# Patient Record
Sex: Male | Born: 1940 | Race: White | Hispanic: No | Marital: Married | Smoking: Former smoker
Health system: Southern US, Community
[De-identification: ages and names within clinical notes are randomized; demographics above are authoritative.]

## PROBLEM LIST (undated history)

## (undated) DIAGNOSIS — D649 Anemia, unspecified: Secondary | ICD-10-CM

## (undated) DIAGNOSIS — N189 Chronic kidney disease, unspecified: Secondary | ICD-10-CM

## (undated) DIAGNOSIS — I10 Essential (primary) hypertension: Secondary | ICD-10-CM

## (undated) DIAGNOSIS — F329 Major depressive disorder, single episode, unspecified: Secondary | ICD-10-CM

## (undated) DIAGNOSIS — F32A Depression, unspecified: Secondary | ICD-10-CM

## (undated) DIAGNOSIS — F419 Anxiety disorder, unspecified: Secondary | ICD-10-CM

## (undated) DIAGNOSIS — C801 Malignant (primary) neoplasm, unspecified: Secondary | ICD-10-CM

## (undated) HISTORY — DX: Chronic kidney disease, unspecified: N18.9

## (undated) HISTORY — DX: Anxiety disorder, unspecified: F41.9

## (undated) HISTORY — DX: Major depressive disorder, single episode, unspecified: F32.9

## (undated) HISTORY — DX: Anemia, unspecified: D64.9

## (undated) HISTORY — PX: TONSILLECTOMY: SUR1361

## (undated) HISTORY — DX: Depression, unspecified: F32.A

## (undated) HISTORY — DX: Essential (primary) hypertension: I10

## (undated) HISTORY — DX: Malignant (primary) neoplasm, unspecified: C80.1

## (undated) HISTORY — PX: NOSE SURGERY: SHX723

## (undated) HISTORY — PX: KIDNEY SURGERY: SHX687

---

## 2010-11-08 DIAGNOSIS — R0982 Postnasal drip: Secondary | ICD-10-CM | POA: Insufficient documentation

## 2010-11-08 DIAGNOSIS — I10 Essential (primary) hypertension: Secondary | ICD-10-CM | POA: Insufficient documentation

## 2010-11-08 DIAGNOSIS — R05 Cough: Secondary | ICD-10-CM | POA: Insufficient documentation

## 2010-11-08 DIAGNOSIS — R682 Dry mouth, unspecified: Secondary | ICD-10-CM | POA: Insufficient documentation

## 2010-11-08 DIAGNOSIS — F319 Bipolar disorder, unspecified: Secondary | ICD-10-CM | POA: Insufficient documentation

## 2011-02-13 DIAGNOSIS — K432 Incisional hernia without obstruction or gangrene: Secondary | ICD-10-CM | POA: Insufficient documentation

## 2011-08-18 DIAGNOSIS — E7800 Pure hypercholesterolemia, unspecified: Secondary | ICD-10-CM | POA: Insufficient documentation

## 2012-05-10 DIAGNOSIS — R3915 Urgency of urination: Secondary | ICD-10-CM | POA: Insufficient documentation

## 2012-08-13 ENCOUNTER — Ambulatory Visit: Payer: Self-pay | Admitting: Neurology

## 2013-01-10 DIAGNOSIS — N289 Disorder of kidney and ureter, unspecified: Secondary | ICD-10-CM | POA: Insufficient documentation

## 2013-01-10 DIAGNOSIS — Z87448 Personal history of other diseases of urinary system: Secondary | ICD-10-CM | POA: Insufficient documentation

## 2013-01-10 DIAGNOSIS — N400 Enlarged prostate without lower urinary tract symptoms: Secondary | ICD-10-CM | POA: Insufficient documentation

## 2013-07-04 DIAGNOSIS — D509 Iron deficiency anemia, unspecified: Secondary | ICD-10-CM | POA: Insufficient documentation

## 2013-07-04 DIAGNOSIS — F319 Bipolar disorder, unspecified: Secondary | ICD-10-CM | POA: Insufficient documentation

## 2013-07-04 DIAGNOSIS — C649 Malignant neoplasm of unspecified kidney, except renal pelvis: Secondary | ICD-10-CM | POA: Insufficient documentation

## 2013-09-07 ENCOUNTER — Ambulatory Visit: Payer: Self-pay | Admitting: Neurology

## 2013-09-27 ENCOUNTER — Ambulatory Visit: Payer: Self-pay | Admitting: Neurology

## 2013-12-12 ENCOUNTER — Ambulatory Visit: Payer: Self-pay | Admitting: Gastroenterology

## 2013-12-13 LAB — PATHOLOGY REPORT

## 2014-01-05 DIAGNOSIS — K5190 Ulcerative colitis, unspecified, without complications: Secondary | ICD-10-CM | POA: Insufficient documentation

## 2014-02-21 DIAGNOSIS — F32A Depression, unspecified: Secondary | ICD-10-CM | POA: Insufficient documentation

## 2014-02-21 DIAGNOSIS — F329 Major depressive disorder, single episode, unspecified: Secondary | ICD-10-CM | POA: Insufficient documentation

## 2014-04-11 ENCOUNTER — Ambulatory Visit: Payer: Self-pay | Admitting: Internal Medicine

## 2014-04-12 LAB — KAPPA/LAMBDA FREE LIGHT CHAINS (ARMC)

## 2014-04-13 ENCOUNTER — Observation Stay: Payer: Self-pay | Admitting: Internal Medicine

## 2014-04-13 LAB — COMPREHENSIVE METABOLIC PANEL
ALBUMIN: 3.5 g/dL (ref 3.4–5.0)
ALK PHOS: 146 U/L — AB
Anion Gap: 5 — ABNORMAL LOW (ref 7–16)
BUN: 19 mg/dL — ABNORMAL HIGH (ref 7–18)
Bilirubin,Total: 0.5 mg/dL (ref 0.2–1.0)
CALCIUM: 8.8 mg/dL (ref 8.5–10.1)
Chloride: 112 mmol/L — ABNORMAL HIGH (ref 98–107)
Co2: 24 mmol/L (ref 21–32)
Creatinine: 1.23 mg/dL (ref 0.60–1.30)
EGFR (African American): >60
EGFR (Non-African Amer.): >60
Glucose: 110 mg/dL — ABNORMAL HIGH (ref 65–99)
Osmolality: 284 (ref 275–301)
Potassium: 3.9 mmol/L (ref 3.5–5.1)
SGOT(AST): 34 U/L (ref 15–37)
SGPT (ALT): 44 U/L
SODIUM: 141 mmol/L (ref 136–145)
Total Protein: 7.2 g/dL (ref 6.4–8.2)

## 2014-04-13 LAB — TROPONIN I
TROPONIN-I: 0.06 ng/mL — AB
Troponin-I: 0.07 ng/mL — ABNORMAL HIGH

## 2014-04-13 LAB — CK-MB
CK-MB: 1.2 ng/mL (ref 0.5–3.6)
CK-MB: 0.9 ng/mL (ref 0.5–3.6)

## 2014-04-13 LAB — CBC
HCT: 41.8 % (ref 40.0–52.0)
HGB: 13.8 g/dL (ref 13.0–18.0)
MCH: 29.5 pg (ref 26.0–34.0)
MCHC: 33.1 g/dL (ref 32.0–36.0)
MCV: 89 fL (ref 80–100)
Platelet: 271 10*3/uL (ref 150–440)
RBC: 4.68 10*6/uL (ref 4.40–5.90)
RDW: 15.9 % — AB (ref 11.5–14.5)
WBC: 6 10*3/uL (ref 3.8–10.6)

## 2014-04-14 LAB — BASIC METABOLIC PANEL
ANION GAP: 9 (ref 7–16)
BUN: 18 mg/dL (ref 7–18)
Calcium, Total: 8.5 mg/dL (ref 8.5–10.1)
Chloride: 110 mmol/L — ABNORMAL HIGH (ref 98–107)
Co2: 21 mmol/L (ref 21–32)
Creatinine: 1.36 mg/dL — ABNORMAL HIGH (ref 0.60–1.30)
EGFR (African American): >60
GFR CALC NON AF AMER: 54 — AB
GLUCOSE: 159 mg/dL — AB (ref 65–99)
OSMOLALITY: 285 (ref 275–301)
Potassium: 3.9 mmol/L (ref 3.5–5.1)
Sodium: 140 mmol/L (ref 136–145)

## 2014-04-14 LAB — CBC WITH DIFFERENTIAL/PLATELET
BASOS ABS: 0 10*3/uL (ref 0.0–0.1)
Basophil %: 0.1 %
Eosinophil #: 0 10*3/uL (ref 0.0–0.7)
Eosinophil %: 0 %
HCT: 38.1 % — ABNORMAL LOW (ref 40.0–52.0)
HGB: 12.6 g/dL — ABNORMAL LOW (ref 13.0–18.0)
LYMPHS ABS: 0.8 10*3/uL — AB (ref 1.0–3.6)
LYMPHS PCT: 15.3 %
MCH: 29.1 pg (ref 26.0–34.0)
MCHC: 32.9 g/dL (ref 32.0–36.0)
MCV: 88 fL (ref 80–100)
MONOS PCT: 0.9 %
Monocyte #: 0 x10 3/mm — ABNORMAL LOW (ref 0.2–1.0)
Neutrophil #: 4.4 10*3/uL (ref 1.4–6.5)
Neutrophil %: 83.7 %
PLATELETS: 264 10*3/uL (ref 150–440)
RBC: 4.31 10*6/uL — ABNORMAL LOW (ref 4.40–5.90)
RDW: 15.4 % — ABNORMAL HIGH (ref 11.5–14.5)
WBC: 5.3 10*3/uL (ref 3.8–10.6)

## 2014-04-14 LAB — LIPID PANEL
Cholesterol: 139 mg/dL (ref 0–200)
HDL Cholesterol: 58 mg/dL (ref 40–60)
LDL CHOLESTEROL, CALC: 73 mg/dL (ref 0–100)
Triglycerides: 41 mg/dL (ref 0–200)
VLDL Cholesterol, Calc: 8 mg/dL (ref 5–40)

## 2014-04-14 LAB — TROPONIN I: TROPONIN-I: 0.06 ng/mL — AB

## 2014-04-14 LAB — CK-MB: CK-MB: 0.9 ng/mL (ref 0.5–3.6)

## 2014-04-15 ENCOUNTER — Ambulatory Visit: Payer: Self-pay | Admitting: Internal Medicine

## 2014-04-28 LAB — HEMOGLOBIN: HGB: 12.4 g/dL — AB (ref 13.0–18.0)

## 2014-05-15 ENCOUNTER — Ambulatory Visit: Payer: Self-pay | Admitting: Internal Medicine

## 2014-05-24 DIAGNOSIS — C9110 Chronic lymphocytic leukemia of B-cell type not having achieved remission: Secondary | ICD-10-CM | POA: Insufficient documentation

## 2014-05-24 DIAGNOSIS — F0280 Dementia in other diseases classified elsewhere without behavioral disturbance: Secondary | ICD-10-CM | POA: Insufficient documentation

## 2014-05-24 DIAGNOSIS — G309 Alzheimer's disease, unspecified: Secondary | ICD-10-CM

## 2014-07-04 ENCOUNTER — Ambulatory Visit: Payer: Self-pay | Admitting: Nephrology

## 2014-07-06 ENCOUNTER — Ambulatory Visit
Admit: 2014-07-06 | Disposition: A | Discharge status: Home / Self Care | Payer: Self-pay | Attending: Internal Medicine | Admitting: Internal Medicine

## 2014-07-06 LAB — FOLATE

## 2014-07-06 LAB — IRON AND TIBC
IRON BIND. CAP.(TOTAL): 359 (ref 250–450)
IRON SATURATION: 30.7
Iron: 110 ug/dL
UNBOUND IRON-BIND. CAP.: 248.7

## 2014-07-06 LAB — FERRITIN: Ferritin (ARMC): 57 ng/mL

## 2014-07-06 LAB — CANCER CENTER HEMOGLOBIN: HGB: 13.2 g/dL (ref 13.0–18.0)

## 2014-07-14 ENCOUNTER — Ambulatory Visit
Admit: 2014-07-14 | Disposition: A | Discharge status: Home / Self Care | Payer: Self-pay | Attending: Internal Medicine | Admitting: Internal Medicine

## 2014-08-05 NOTE — H&P (Signed)
PATIENT NAME:  Kurt Miller, Kurt Miller MR#:  [id number] DATE OF BIRTH:  01-Jan-1941  DATE OF ADMISSION:  04/13/2014  REFERRING EMERGENCY ROOM PHYSICIAN:  Dr. Kerman Passey.    PRIMARY CARE PHYSICIAN:  Dr. Gilford Rile with [facility].   PRIMARY GASTROENTEROLOGIST:  Dr. Rayann Heman.    HEMATOLOGIST/ONCOLOGIST:  Dr. Ma Hillock.    HISTORY OF PRESENT ILLNESS: This very pleasant 74 year old man with past medical history of iron deficiency anemia received his first iron infusion this morning at 9:30 a.m. which lasted until about 2:30 a.m. He does not know what type of iron he received. He then went shopping and within an hour of receiving the infusion he noticed that his hands were beginning to swell as were his feet and ankles. He then returned to the infusion center for further evaluation where he developed chest pain and chest pressure. He was then referred to the Emergency Room for further evaluation. He denies any nausea, diaphoresis, shortness of breath, presyncopal feeling. The pain is substernal and does not radiate. The pain is more of a pressure and is about 5-7 in intensity. He has not had any wheezing, sensation of mouth or tongue swelling. He continues to have a puffy feeling in his face and swelling over his hands and feet. He does have a sensation that he cannot take a full deep breath. He reports that his symptoms have been improving since he arrived at the Emergency Room where he has received 50 mg of Benadryl, 125 mg of Solu-Medrol. On Emergency Room evaluation his troponin is noted to be slightly elevated. Hospitalist services are asked to admit for further evaluation and treatment.   PAST MEDICAL HISTORY:  1.  Ulcerative colitis.  2.  Iron deficiency anemia.  3.  Hypertension.  4.  Bipolar disease.  5.  Mild dementia.  6.  Benign prosthetic hypertrophy.  7.  History of kidney cancer status post nephrectomy.   PAST SURGICAL HISTORY:   1.  Uvulectomy for obstructive sleep apnea.  2.  Cataract surgery.  3.   Nephrectomy.  4.  Bladder repair.    SOCIAL HISTORY: The patient lives with his wife. He is mobile, does not use a cane or a walker. He does not smoke cigarettes or drink alcohol. He is a former bus Geophysicist/field seismologist.   FAMILY MEDICAL HISTORY: Positive for coronary artery disease in his father and brothers. His mother had dementia.   ALLERGIES: HE IS ALLERGIC TO ACETAMINOPHEN AND RANITIDINE.   HOME MEDICATIONS:  1. Vitamin D3, 2000 international units 1 tablet daily.  2. Seroquel 200 mg 1 tablet twice a day.  3. Pravastatin 20 mg 1 tablet once a day.  4. Omeprazole 40 mg 1 tablet once a day.  5. Multivitamin 1 tablet daily.  6. MiraLax 17 grams 1 dose every other day for constipation as needed.  7. Memantine 5 mg 1 tablet twice a day.  8. Lisinopril 40 mg 1 tablet daily.  9. Fish oil 1000 mg 1 capsule twice a day.  10. Finasteride 5 mg 1 tablet daily.  11. Donepezil 10 mg 1 tablet once a day at bedtime.  12. Colace 100 mg 1 capsule twice a day.  13. Citalopram 10 mg 1 tablet once a day.  14. Cinnamon 500 mg 1 capsule 2 times a day.  15. Aspirin 81 mg 1 tablet once a day.  16. Asacol 800 mg 1 tablet twice a day.  17. Amlodipine 10 mg 1 tablet once a day.    REVIEW OF SYSTEMS:  GENERAL: No fevers, chills, fatigue, pain other than described above. No change in weight.  HEENT: No change in vision or hearing. No pain in the eyes or ears, no sore throat, no difficulty swallowing, no sinus congestion.  RESPIRATORY: Positive as above for inability to take a deep breath, no coughing, wheezing, orthopnea, no COPD or asthma.  CARDIOVASCULAR: Positive as above for chest pressure, no syncope, he does have swelling in the lower extremities as well as upper extremities, no orthopnea, paroxysmal nocturnal dyspnea. No palpitations.  GASTROINTESTINAL: No nausea, vomiting, diarrhea, or abdominal pain.  GENITOURINARY: No urinary frequency or dysuria.  MUSCULOSKELETAL: No new pain in the neck, back, hips,  shoulders, knees, or ankles, no tender or swollen joints, no gout, no new muscle tenderness.  NEUROLOGIC: No confusion above baseline mild dementia, no focal numbness or weakness, no headache, seizure, stroke.  PSYCHIATRIC: No uncontrolled depression or anxiety. He does have a history of bipolar disorder which is controlled.   PHYSICAL EXAMINATION:  VITAL SIGNS: Temperature 98 degrees, pulse 80, respirations 16, blood pressure 122/69, oxygenation 99% on room air.  GENERAL: No acute distress, resting comfortably in the examination bed.  HEENT: Pupils are equal, round, and reactive to light, extraocular motion is intact, oral mucous membranes are pink and moist, posterior oropharynx is clear with post surgical change, no edema. No exudate, no erythema.  NECK: Supple, no cervical lymphadenopathy. Thyroid is nontender.  RESPIRATORY: Lungs are clear to auscultation bilaterally with good air movement.  CARDIOVASCULAR: Regular rate and rhythm, no murmurs, rubs, or gallops. He does have very mild swelling of the hands and feet.  ABDOMEN:  Soft, nontender, nondistended, bowel sounds are normal.  MUSCULOSKELETAL: Strength is 5 out of 5 throughout, range of motion is normal, there are no tender or swollen joints.  SKIN: He has several cuts in the right forearm and hand, no rashes or other open wounds.  NEUROLOGIC: Cranial nerves II through XII grossly intact, strength and sensation are intact. Strength 5 out of 5. Tone is normal. Nonfocal neurologic examination.  PSYCHIATRIC: The patient is alert and oriented x 4 with good insight into his clinical condition.   LABORATORY DATA: Sodium 141, potassium 3.9, chloride 112, bicarbonate 24, BUN 19, creatinine 1.23, glucose 110. LFTs are normal with the exception of an elevated alkaline phosphatase at 146. Troponin is elevated at 0.06. White blood cells 6.0, hemoglobin 13.8, platelets 271,000, MCV is 89.   IMAGING: Chest x-ray, minimal scarring at the left base, no  edema or consolidation.  ASSESSMENT AND PLAN:  1.  Elevated troponin: Troponin is very mildly elevated. At this point we will cycle cardiac enzymes before calling this an NSTEMI.  If cardiac enzymes continue to escalate we will start heparin and consult cardiology. There are no concerning EKG changes at this time. We will monitor on telemetry. I have started aspirin and statin, nitroglycerin. 2.  Extremity swelling: This is upper and lower extremity swelling not typical of congestive heart failure, no other signs of congestive heart failure. I think this is likely an allergic reaction to the iron infusion. He has received Benadryl and Solu-Medrol in the ED and symptoms seem to be improving. He does not know what type of iron infusion he received. Dr. Ma Hillock should be called tomorrow to discuss this reaction with him and to be sure that the patient does not receive this type of iron in the future.  3.  Hypertension: Blood pressure is well controlled. We will continue him with his home regimen  of amlodipine and lisinopril. No angioedema. 4.  Hyperlipidemia: Continue statin.  5.  Anemia: He is not anemic at this time.  6.  Bipolar disorder: This seems stable and controlled. We will continue with citalopram and Seroquel.  7.  Dementia:  We will continue with Aricept and Namenda.  8.  Benign prostatic hypertrophy: No difficulty at this time. Continue with finasteride. 9.  Prophylaxis. We will start heparin for DVT prophylaxis. No GI prophylaxis at this time.   TIME SPENT ON ADMISSION: 45 minutes.  Patient discussed with Dr. Gilford Rile and [facility] clinics will take over care in the morning.   ____________________________ Earleen Newport. Volanda Napoleon, MD cpw:bu D: 04/13/2014 18:53:43 ET T: 04/13/2014 19:18:06 ET JOB#: [id number]  cc: Barnetta Chapel P. Volanda Napoleon, MD, <Dictator> Aldean Jewett MD ELECTRONICALLY SIGNED 04/13/2014 23:41

## 2014-08-24 DIAGNOSIS — G4733 Obstructive sleep apnea (adult) (pediatric): Secondary | ICD-10-CM | POA: Insufficient documentation

## 2014-10-02 ENCOUNTER — Other Ambulatory Visit: Payer: Self-pay

## 2014-10-02 DIAGNOSIS — D472 Monoclonal gammopathy: Secondary | ICD-10-CM

## 2014-10-02 DIAGNOSIS — D509 Iron deficiency anemia, unspecified: Secondary | ICD-10-CM

## 2014-10-03 ENCOUNTER — Other Ambulatory Visit: Payer: Self-pay

## 2014-10-03 ENCOUNTER — Ambulatory Visit: Payer: Self-pay | Admitting: Internal Medicine

## 2014-10-11 ENCOUNTER — Inpatient Hospital Stay (HOSPITAL_BASED_OUTPATIENT_CLINIC_OR_DEPARTMENT_OTHER): Payer: Medicare Other | Admitting: Internal Medicine

## 2014-10-11 ENCOUNTER — Inpatient Hospital Stay: Payer: Medicare Other | Attending: Internal Medicine

## 2014-10-11 ENCOUNTER — Encounter: Payer: Self-pay | Admitting: Internal Medicine

## 2014-10-11 VITALS — BP 144/80 | HR 87 | Temp 98.1°F | Resp 18 | Ht 70.0 in | Wt 229.5 lb

## 2014-10-11 DIAGNOSIS — N189 Chronic kidney disease, unspecified: Secondary | ICD-10-CM

## 2014-10-11 DIAGNOSIS — R413 Other amnesia: Secondary | ICD-10-CM | POA: Insufficient documentation

## 2014-10-11 DIAGNOSIS — D509 Iron deficiency anemia, unspecified: Secondary | ICD-10-CM

## 2014-10-11 DIAGNOSIS — G43909 Migraine, unspecified, not intractable, without status migrainosus: Secondary | ICD-10-CM | POA: Insufficient documentation

## 2014-10-11 DIAGNOSIS — Z87891 Personal history of nicotine dependence: Secondary | ICD-10-CM | POA: Diagnosis not present

## 2014-10-11 DIAGNOSIS — R109 Unspecified abdominal pain: Secondary | ICD-10-CM | POA: Insufficient documentation

## 2014-10-11 DIAGNOSIS — R0609 Other forms of dyspnea: Secondary | ICD-10-CM | POA: Insufficient documentation

## 2014-10-11 DIAGNOSIS — D472 Monoclonal gammopathy: Secondary | ICD-10-CM | POA: Diagnosis not present

## 2014-10-11 DIAGNOSIS — Z7982 Long term (current) use of aspirin: Secondary | ICD-10-CM

## 2014-10-11 DIAGNOSIS — K921 Melena: Secondary | ICD-10-CM | POA: Insufficient documentation

## 2014-10-11 DIAGNOSIS — F418 Other specified anxiety disorders: Secondary | ICD-10-CM | POA: Insufficient documentation

## 2014-10-11 DIAGNOSIS — F0390 Unspecified dementia without behavioral disturbance: Secondary | ICD-10-CM

## 2014-10-11 DIAGNOSIS — Z85528 Personal history of other malignant neoplasm of kidney: Secondary | ICD-10-CM | POA: Insufficient documentation

## 2014-10-11 DIAGNOSIS — M25559 Pain in unspecified hip: Secondary | ICD-10-CM | POA: Insufficient documentation

## 2014-10-11 DIAGNOSIS — K859 Acute pancreatitis without necrosis or infection, unspecified: Secondary | ICD-10-CM | POA: Insufficient documentation

## 2014-10-11 DIAGNOSIS — I129 Hypertensive chronic kidney disease with stage 1 through stage 4 chronic kidney disease, or unspecified chronic kidney disease: Secondary | ICD-10-CM

## 2014-10-11 DIAGNOSIS — R5382 Chronic fatigue, unspecified: Secondary | ICD-10-CM | POA: Diagnosis not present

## 2014-10-11 DIAGNOSIS — Z79899 Other long term (current) drug therapy: Secondary | ICD-10-CM

## 2014-10-11 DIAGNOSIS — K319 Disease of stomach and duodenum, unspecified: Secondary | ICD-10-CM | POA: Insufficient documentation

## 2014-10-11 DIAGNOSIS — E785 Hyperlipidemia, unspecified: Secondary | ICD-10-CM

## 2014-10-11 DIAGNOSIS — Z8719 Personal history of other diseases of the digestive system: Secondary | ICD-10-CM | POA: Insufficient documentation

## 2014-10-11 DIAGNOSIS — F411 Generalized anxiety disorder: Secondary | ICD-10-CM | POA: Insufficient documentation

## 2014-10-11 DIAGNOSIS — M9979 Connective tissue and disc stenosis of intervertebral foramina of abdomen and other regions: Secondary | ICD-10-CM | POA: Insufficient documentation

## 2014-10-11 DIAGNOSIS — R609 Edema, unspecified: Secondary | ICD-10-CM | POA: Insufficient documentation

## 2014-10-11 DIAGNOSIS — R351 Nocturia: Secondary | ICD-10-CM | POA: Insufficient documentation

## 2014-10-11 DIAGNOSIS — M542 Cervicalgia: Secondary | ICD-10-CM | POA: Insufficient documentation

## 2014-10-11 DIAGNOSIS — M545 Low back pain, unspecified: Secondary | ICD-10-CM | POA: Insufficient documentation

## 2014-10-11 DIAGNOSIS — K5900 Constipation, unspecified: Secondary | ICD-10-CM | POA: Insufficient documentation

## 2014-10-11 DIAGNOSIS — K8020 Calculus of gallbladder without cholecystitis without obstruction: Secondary | ICD-10-CM | POA: Insufficient documentation

## 2014-10-11 DIAGNOSIS — G4730 Sleep apnea, unspecified: Secondary | ICD-10-CM | POA: Insufficient documentation

## 2014-10-11 LAB — CREATININE, SERUM
Creatinine, Ser: 1.33 mg/dL — ABNORMAL HIGH (ref 0.61–1.24)
GFR calc Af Amer: 59 mL/min — ABNORMAL LOW (ref >60)
GFR calc non Af Amer: 51 mL/min — ABNORMAL LOW (ref >60)

## 2014-10-11 LAB — IRON AND TIBC
Iron: 120 ug/dL (ref 45–182)
SATURATION RATIOS: 31 % (ref 17.9–39.5)
TIBC: 382 ug/dL (ref 250–450)
UIBC: 262 ug/dL

## 2014-10-11 LAB — HEMOGLOBIN: HEMOGLOBIN: 13.6 g/dL (ref 13.0–18.0)

## 2014-10-11 LAB — FERRITIN: FERRITIN: 26 ng/mL (ref 24–336)

## 2014-10-11 LAB — CALCIUM: CALCIUM: 8.7 mg/dL — AB (ref 8.9–10.3)

## 2014-10-12 LAB — PROTEIN ELECTROPHORESIS, SERUM
A/G RATIO SPE: 1.3 (ref 0.7–1.7)
ALBUMIN ELP: 3.9 g/dL (ref 2.9–4.4)
Alpha-1-Globulin: 0.2 g/dL (ref 0.0–0.4)
Alpha-2-Globulin: 0.9 g/dL (ref 0.4–1.0)
Beta Globulin: 1 g/dL (ref 0.7–1.3)
Gamma Globulin: 0.8 g/dL (ref 0.4–1.8)
Globulin, Total: 2.9 g/dL (ref 2.2–3.9)
M-SPIKE, %: 0.4 g/dL — AB
Total Protein ELP: 6.8 g/dL (ref 6.0–8.5)

## 2014-10-12 LAB — KAPPA/LAMBDA LIGHT CHAINS
Kappa free light chain: 26.31 mg/L — ABNORMAL HIGH (ref 3.30–19.40)
Kappa, lambda light chain ratio: 1.71 — ABNORMAL HIGH (ref 0.26–1.65)
Lambda free light chains: 15.42 mg/L (ref 5.71–26.30)

## 2014-10-21 NOTE — Progress Notes (Signed)
[place]  [phone] Fax:(336) [phone]     ID: Kurt Miller OB: 1941-02-16  MR#: [id number]  TDV#:[id number]  No care team member to display  CHIEF COMPLAINT/DIAGNOSIS:  1. Persistent anemia secondary to iron deficiency, intolerant of oral iron - got parenteral iron therapy with IV Venofer but had allergic reaction post-Venofer dose on 04/13/14, and second dose was cancelled.  2. 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  (Labs on 03/28/14 showed ferritin low at 12 with ref range 23 to 336, serum iron 31, iron saturation 7%, TIBC 419. Serum protein electrophoresis on 11/09/13 showed monoclonal IgG kappa detected by IFE not quanitfied, urine reported 0.3 M component. On 02/22/14 Hb 11.1, WBC 8300, platelets 302, Cr 1.2, LFT unremarkable except alk phos of 142).    HISTORY OF PRESENT ILLNESS:  Patient returns for continued hematology followup, he was seen 6 months ago. States that he is doing fairly steady. He has chronic fatigue on exertion and chronic dyspnea on exertion which is back at baseline. He cannot tolerate oral iron but is trying to eat more iron-rich foods. He continues to follow with GI, he has intermittent small bright red blood in stools and is status post recent colonoscopy in August 2015. Denies any ongoing blood in the stools or urine. Denies any new bone pains, he has monoclonal gammopathy. No fevers or night sweats. Denies feeling enlarged lymph node masses on self-exam.  REVIEW OF SYSTEMS:   ROS As in HPI above. In addition, no fevers. No new headaches or focal weakness.  No new sore throat, cough, shortness of breath, sputum, hemoptysis or chest pain. No dizziness or palpitation. No abdominal pain, constipation, diarrhea, dysuria or hematuria. No new skin rash or bleeding  symptoms. No new paresthesias in extremities. No polyuria polydipsia.  PAST MEDICAL HISTORY: Reviewed. Past Medical History  Diagnosis Date  . Cancer     Kidney  . Hypertension   . Anxiety   . Anemia   . Depression   . Chronic kidney disease           Hypertension.  Hyperlipidemia.  BPH.  Postnasal drip.  Depression and generalized anxiety disorder.  Renal insufficiency.  Personal history of renal cancer, laparoscopically dissected at [facility] in 2005, reportedly was small tumor and did not need other treatments,  Cervicalgia.  Lumbago.  Sleep apnea.  Dementia and memory loss.  Urinary frequency.  History of acute pancreatitis.  Migraines.  Calculus of the gallbladder.  Degeneration of intervetebral disk.  Colonoscopy August 2015, reported internal hemorrhoids, diffuse mild inflammation of the entire examined colon with biopsy reporting mild             to moderate active colitis.  PAST SURGICAL HISTORY: Reviewed. As above.  FAMILY HISTORY: Reviewed. Family History  Problem Relation Age of Onset  . Cancer Mother   . Diabetes Mother   . Heart attack Father   . Hypertension Brother   . Stroke Brother   . Hypertension Brother     SOCIAL HISTORY: Reviewed. History  Substance Use Topics  . Smoking status: Former Smoker -- 0.25 packs/day for 10 years    Types: Cigarettes    Quit date: 05/12/1984  . Smokeless tobacco: Never Used  . Alcohol Use: No    Allergies  Allergen Reactions  . Acetaminophen Other (See Comments)    Pancreatitis  . Famotidine Other (See  Comments)    Pancreatitis    Current Outpatient Prescriptions  Medication Sig Dispense Refill  . amLODipine (NORVASC) 10 MG tablet Take by mouth.    Marland Kitchen aspirin 81 MG tablet Take by mouth.    . Cinnamon 500 MG capsule Take by mouth.    . citalopram (CELEXA) 10 MG tablet TAKE 1 TABLET DAILY    . cyclobenzaprine (FLEXERIL) 5 MG tablet Take by mouth.    . docusate sodium (COLACE) 100 MG capsule     . donepezil  (ARICEPT) 10 MG tablet TAKE 1 TABLET EVERY NIGHT    . finasteride (PROSCAR) 5 MG tablet TAKE 1 TABLET DAILY    . Glycerin, Laxative, (RA GLYCERIN ADULT) 80.7 % SUPP as needed.    Marland Kitchen lisinopril (PRINIVIL,ZESTRIL) 40 MG tablet TAKE 1 TABLET EVERY MORNING FOR BLOOD PRESSURE    . Mesalamine (ASACOL HD) 800 MG TBEC TAKE 2 TABLETS TWICE A DAY    . Multiple Vitamins-Minerals (SENIOR MULTIVITAMIN PLUS PO) Take by mouth.    . Omega-3 Fatty Acids (FISH OIL) 1000 MG CAPS Take by mouth.    Marland Kitchen omeprazole (PRILOSEC) 40 MG capsule TAKE 1 CAPSULE DAILY    . polyethylene glycol powder (GLYCOLAX/MIRALAX) powder Take by mouth.    . pravastatin (PRAVACHOL) 20 MG tablet TAKE 1 TABLET DAILY    . QUEtiapine (SEROQUEL) 200 MG tablet Take 243m po qam, and 6058mpo qpm    . sodium bicarbonate 650 MG tablet 650 mg 2 (two) times daily.    . tamsulosin (FLOMAX) 0.4 MG CAPS capsule TAKE 1 CAPSULE DAILY     No current facility-administered medications for this visit.    PHYSICAL EXAM: Filed Vitals:   10/11/14 1440  BP: 144/80  Pulse: 87  Temp: 98.1 F (36.7 C)  Resp: 18     Body mass index is 32.93 kg/(m^2).       GENERAL: Patient is alert and oriented and in no acute distress. There is no icterus. HEENT: EOMs intact. Oral exam negative for thrush or lesions. No cervical lymphadenopathy. LUNGS: Bilaterally clear to auscultation, no rhonchi. ABDOMEN: Soft, nontender. No hepatosplenomegaly clinically.  EXTREMITIES: No pedal edema. LYMPHATICS: No palpable adenopathy in axillary or inguinal areas.   LAB RESULTS: Serum Fe 120, TIBC 382, iron sat 31%, ferritin 26, Cr 1.33. SIEP with M-spike 0.4. Kappa/Lambda ratio 1.71. Hb 13.6.    Component Value Date/Time   NA 140 04/14/2014 0030   K 3.9 04/14/2014 0030   CL 110* 04/14/2014 0030   CO2 21 04/14/2014 0030   GLUCOSE 159* 04/14/2014 0030   BUN 18 04/14/2014 0030   CREATININE 1.33* 10/11/2014 1412   CREATININE 1.36* 04/14/2014 0030   CALCIUM 8.7* 10/11/2014  1412   CALCIUM 8.5 04/14/2014 0030   PROT 7.2 04/13/2014 1549   ALBUMIN 3.5 04/13/2014 1549   AST 34 04/13/2014 1549   ALT 44 04/13/2014 1549   ALKPHOS 146* 04/13/2014 1549   GFRNONAA 51* 10/11/2014 1412   GFRAA 59* 10/11/2014 1412   03/28/14 - ferritin low at 12 with reference range 23 to 336, serum iron 31, iron saturation 7%, TIBC 419.  11/09/13 - Serum protein electrophoresis showed monoclonal IgG kappa detected by IFE not quanitfied, urine reported 0.3 M component.  02/22/14 - Hb was 11.1, MCV 89.2, WBC 8300, platelets 302, Cr 1.2, LFT unremarkable except alkaline phosphatase of 142.  ASSESSMENT / PLAN:   1. Anemia secondary to iron deficiency, patient intolerant of oral iron. He got parenteral iron therapy with IV  Venofer but had allergic reaction post-Venofer dose on 04/13/14, and second dose was cancelled -  Have reviewed labs from today and d/w patient. Hemoglobin today remains in normal range, iron study also is doing well. Given this, plan is continued monitoring, will see him back at ne year with repeat CBC and iron study. If he develops persistent or recurrent iron deficiency, we will then need to consider a different preparation of IV iron at that time.  2. 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 In July 2015 reported monoclonal IgG kappa but not quantified. Urine reported 0.3 M component. Patient does not have new bone pains, and recent workup on April 11, 2014 showed free kappa light chains mildly elevated at 36.93, normal free lambda light chain level and kappa/lambda ratio slightly elevated at 1.71. 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 Patient  was explained about this finding, that if he does have early CLL it is usually an indolent but incurable leukemia, and he does not need any treatment at this time, likely stage 0, and will need continued monitoring.  3. In between visits, patient advised to call or come to ER in case of any fevers, night sweats, lymph node masses felt on self exam, new bone pains, progressive anemia symptoms or acute sickness. He is agreeable to this plan.    Leia Alf, MD   10/21/2014 11:59 PM

## 2014-11-09 ENCOUNTER — Ambulatory Visit: Payer: Self-pay | Admitting: Psychiatry

## 2015-10-10 ENCOUNTER — Inpatient Hospital Stay: Payer: Medicare Other | Attending: Internal Medicine | Admitting: Oncology

## 2015-10-10 ENCOUNTER — Inpatient Hospital Stay: Payer: Medicare Other

## 2015-10-10 ENCOUNTER — Ambulatory Visit: Payer: Medicare Other | Admitting: Internal Medicine

## 2015-10-10 VITALS — BP 126/76 | HR 68 | Temp 98.5°F | Resp 18 | Wt 231.2 lb

## 2015-10-10 DIAGNOSIS — I129 Hypertensive chronic kidney disease with stage 1 through stage 4 chronic kidney disease, or unspecified chronic kidney disease: Secondary | ICD-10-CM | POA: Diagnosis not present

## 2015-10-10 DIAGNOSIS — R5382 Chronic fatigue, unspecified: Secondary | ICD-10-CM | POA: Diagnosis not present

## 2015-10-10 DIAGNOSIS — D472 Monoclonal gammopathy: Secondary | ICD-10-CM | POA: Diagnosis present

## 2015-10-10 DIAGNOSIS — Z79899 Other long term (current) drug therapy: Secondary | ICD-10-CM | POA: Insufficient documentation

## 2015-10-10 DIAGNOSIS — F418 Other specified anxiety disorders: Secondary | ICD-10-CM | POA: Insufficient documentation

## 2015-10-10 DIAGNOSIS — Z7982 Long term (current) use of aspirin: Secondary | ICD-10-CM | POA: Diagnosis not present

## 2015-10-10 DIAGNOSIS — Z87891 Personal history of nicotine dependence: Secondary | ICD-10-CM | POA: Insufficient documentation

## 2015-10-10 DIAGNOSIS — Z888 Allergy status to other drugs, medicaments and biological substances status: Secondary | ICD-10-CM | POA: Diagnosis not present

## 2015-10-10 DIAGNOSIS — N189 Chronic kidney disease, unspecified: Secondary | ICD-10-CM | POA: Diagnosis not present

## 2015-10-10 DIAGNOSIS — D509 Iron deficiency anemia, unspecified: Secondary | ICD-10-CM | POA: Insufficient documentation

## 2015-10-10 LAB — CBC WITH DIFFERENTIAL/PLATELET
BASOS ABS: 0 10*3/uL (ref 0–0.1)
BASOS PCT: 1 %
Eosinophils Absolute: 0 10*3/uL (ref 0–0.7)
Eosinophils Relative: 0 %
HEMATOCRIT: 38.7 % — AB (ref 40.0–52.0)
HEMOGLOBIN: 13.5 g/dL (ref 13.0–18.0)
Lymphocytes Relative: 37 %
Lymphs Abs: 1.6 10*3/uL (ref 1.0–3.6)
MCH: 31.6 pg (ref 26.0–34.0)
MCHC: 34.8 g/dL (ref 32.0–36.0)
MCV: 90.7 fL (ref 80.0–100.0)
Monocytes Absolute: 0.4 10*3/uL (ref 0.2–1.0)
Monocytes Relative: 9 %
NEUTROS ABS: 2.4 10*3/uL (ref 1.4–6.5)
NEUTROS PCT: 53 %
Platelets: 233 10*3/uL (ref 150–440)
RBC: 4.27 MIL/uL — ABNORMAL LOW (ref 4.40–5.90)
RDW: 13.6 % (ref 11.5–14.5)
WBC: 4.4 10*3/uL (ref 3.8–10.6)

## 2015-10-10 LAB — CREATININE, SERUM
Creatinine, Ser: 1.46 mg/dL — ABNORMAL HIGH (ref 0.61–1.24)
GFR calc Af Amer: 52 mL/min — ABNORMAL LOW (ref >60)
GFR calc non Af Amer: 45 mL/min — ABNORMAL LOW (ref >60)

## 2015-10-10 LAB — IRON AND TIBC
IRON: 89 ug/dL (ref 45–182)
SATURATION RATIOS: 24 % (ref 17.9–39.5)
TIBC: 379 ug/dL (ref 250–450)
UIBC: 290 ug/dL

## 2015-10-10 LAB — CALCIUM: Calcium: 9.1 mg/dL (ref 8.9–10.3)

## 2015-10-10 LAB — FERRITIN: FERRITIN: 20 ng/mL — AB (ref 24–336)

## 2015-10-10 NOTE — Progress Notes (Signed)
[name]  Telephone:(336) [phone]  Fax:(336) [phone]     Kurt Miller DOB: 11-Oct-1940  MR#: [id number]  QVZ#:[id number]  No care team member to display  CHIEF COMPLAINT:  Chief Complaint  Patient presents with  . MGUS   Persistent anemia secondary to iron deficiency, intolerant of oral iron - got parenteral iron therapy with IV Venofer but had allergic reaction post-Venofer dose on 04/13/14, and second dose was cancelled.  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  (Labs on 03/28/14 showed ferritin low at 12 with ref range 23 to 336, serum iron 31, iron saturation 7%, TIBC 419. Serum protein electrophoresis on 11/09/13 showed monoclonal IgG kappa detected by IFE not quanitfied, urine reported 0.3 M component. On 02/22/14 Hb 11.1, WBC 8300, platelets 302, Cr 1.2, LFT unremarkable except alk phos of 142).   INTERVAL HISTORY:  Patient returns to clinic for one year follow up for anemia and MGUS. He last saw Dr Ma Hillock in July 2016. He currently feels well and is asymptomatic. He does still have some chronic fatigue. He denies any blood in the stools or urine. Denies any new bone pain.  No fevers or night sweats. He has no complaints today.   REVIEW OF SYSTEMS:   Review of Systems  Constitutional: Negative.   HENT: Negative.   Eyes: Negative.   Respiratory: Negative.   Cardiovascular: Negative.   Gastrointestinal: Negative.   Genitourinary: Negative.   Musculoskeletal: Negative.   Skin: Negative.   Neurological: Negative.   Endo/Heme/Allergies: Negative.   Psychiatric/Behavioral: Negative.     As per HPI. Otherwise, a complete review of systems is negatve.  ONCOLOGY HISTORY:  No history exists.    PAST MEDICAL HISTORY: Past Medical History  Diagnosis Date  . Cancer     Kidney  .  Hypertension   . Anxiety   . Anemia   . Depression   . Chronic kidney disease     PAST SURGICAL HISTORY: No past surgical history on file.  FAMILY HISTORY Family History  Problem Relation Age of Onset  . Cancer Mother   . Diabetes Mother   . Heart attack Father   . Hypertension Brother   . Stroke Brother   . Hypertension Brother      ADVANCED DIRECTIVES:    HEALTH MAINTENANCE: Social History  Substance Use Topics  . Smoking status: Former Smoker -- 0.25 packs/day for 10 years    Types: Cigarettes    Quit date: 05/12/1984  . Smokeless tobacco: Never Used  . Alcohol Use: No    Allergies  Allergen Reactions  . Acetaminophen Other (See Comments)    Pancreatitis  . Famotidine Other (See Comments)    Pancreatitis    Current Outpatient Prescriptions  Medication Sig Dispense Refill  . amLODipine (NORVASC) 10 MG tablet Take 10 mg by mouth daily.     Marland Kitchen aspirin 81 MG tablet Take by mouth.    . Cholecalciferol (VITAMIN D3) 2000 units TABS Take 1 tablet by mouth daily.    . Cinnamon 500 MG capsule Take by mouth.    . citalopram (CELEXA) 10 MG tablet TAKE 1 TABLET DAILY    . donepezil (ARICEPT) 10 MG tablet TAKE 1 TABLET EVERY NIGHT    . finasteride (PROSCAR) 5 MG tablet TAKE 1 TABLET DAILY    . fluticasone (FLONASE) 50 MCG/ACT nasal spray     . lisinopril (  PRINIVIL,ZESTRIL) 40 MG tablet TAKE 1 TABLET EVERY MORNING FOR BLOOD PRESSURE    . Magnesium Oxide 500 MG (LAX) TABS Take 1 tablet by mouth daily.    . Melatonin 5 MG CAPS Take 1 capsule by mouth at bedtime as needed.    . Mesalamine (ASACOL HD) 800 MG TBEC TAKE 2 TABLETS TWICE A DAY    . Multiple Vitamins-Minerals (SENIOR MULTIVITAMIN PLUS PO) Take by mouth.    . Omega-3 Fatty Acids (FISH OIL) 1000 MG CAPS Take by mouth.    Marland Kitchen omeprazole (PRILOSEC) 40 MG capsule TAKE 1 CAPSULE DAILY    . pravastatin (PRAVACHOL) 20 MG tablet TAKE 1 TABLET DAILY    . QUEtiapine (SEROQUEL) 100 MG tablet Take 100 mg by mouth 2 (two) times  daily.     . sodium bicarbonate 650 MG tablet 650 mg 2 (two) times daily.    . tamsulosin (FLOMAX) 0.4 MG CAPS capsule TAKE 1 CAPSULE DAILY    . vitamin E (VITAMIN E) 1000 UNIT capsule Take 1,000 Units by mouth daily.    . Glycerin, Laxative, (RA GLYCERIN ADULT) 80.7 % SUPP Reported on 10/10/2015     No current facility-administered medications for this visit.    OBJECTIVE: BP 126/76 mmHg  Pulse 68  Temp(Src) 98.5 F (36.9 C) (Tympanic)  Resp 18  Wt 231 lb 3.2 oz (104.872 kg)   Body mass index is 33.17 kg/(m^2).    ECOG FS:0 - Asymptomatic  General: Well-developed, well-nourished, no acute distress. Eyes: Pink conjunctiva, anicteric sclera. HEENT: Normocephalic, moist mucous membranes, clear oropharnyx. Lungs: Clear to auscultation bilaterally. Heart: Regular rate and rhythm. No rubs, murmurs, or gallops. Abdomen: Soft, nontender, nondistended.   Musculoskeletal: No edema, cyanosis, or clubbing. Neuro: Alert, answering all questions appropriately.  Skin: No rashes or petechiae noted. Psych: Normal affect.   LAB RESULTS:  Appointment on 10/10/2015  Component Date Value Ref Range Status  . WBC 10/10/2015 4.4  3.8 - 10.6 K/uL Final  . RBC 10/10/2015 4.27* 4.40 - 5.90 MIL/uL Final  . Hemoglobin 10/10/2015 13.5  13.0 - 18.0 g/dL Final  . HCT 10/10/2015 38.7* 40.0 - 52.0 % Final  . MCV 10/10/2015 90.7  80.0 - 100.0 fL Final  . MCH 10/10/2015 31.6  26.0 - 34.0 pg Final  . MCHC 10/10/2015 34.8  32.0 - 36.0 g/dL Final  . RDW 10/10/2015 13.6  11.5 - 14.5 % Final  . Platelets 10/10/2015 233  150 - 440 K/uL Final  . Neutrophils Relative % 10/10/2015 53   Final  . Neutro Abs 10/10/2015 2.4  1.4 - 6.5 K/uL Final  . Lymphocytes Relative 10/10/2015 37   Final  . Lymphs Abs 10/10/2015 1.6  1.0 - 3.6 K/uL Final  . Monocytes Relative 10/10/2015 9   Final  . Monocytes Absolute 10/10/2015 0.4  0.2 - 1.0 K/uL Final  . Eosinophils Relative 10/10/2015 0   Final  . Eosinophils Absolute  10/10/2015 0.0  0 - 0.7 K/uL Final  . Basophils Relative 10/10/2015 1   Final  . Basophils Absolute 10/10/2015 0.0  0 - 0.1 K/uL Final  . Creatinine, Ser 10/10/2015 1.46* 0.61 - 1.24 mg/dL Final  . GFR calc non Af Amer 10/10/2015 45* >60 mL/min Final  . GFR calc Af Amer 10/10/2015 52* >60 mL/min Final   Comment: (NOTE) The eGFR has been calculated using the CKD EPI equation. This calculation has not been validated in all clinical situations. eGFR's persistently <60 mL/min signify possible Chronic Kidney Disease.   Marland Kitchen  Ferritin 10/10/2015 20* 24 - 336 ng/mL Final  . Iron 10/10/2015 89  45 - 182 ug/dL Final  . TIBC 10/10/2015 379  250 - 450 ug/dL Final  . Saturation Ratios 10/10/2015 24  17.9 - 39.5 % Final  . UIBC 10/10/2015 290   Final  . Calcium 10/10/2015 9.1  8.9 - 10.3 mg/dL Final    STUDIES: No results found.  ASSESSMENT: Anemia, MGUS  PLAN:  No problem-specific assessment & plan notes found for this encounter.  1. Anemia:  Patient intolerant of oral iron. He had parenteral iron therapy with IV Venofer but had allergic reaction post-Venofer dose on 04/13/14, and second dose was cancelled. HGB today is WNL. Iron studies are still pending. No IV iron needed at this time. Follow up in one year with CBC and iron studies one week prior to visit.  2. MGUS: Diagnosed July 2015. 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 June 2016 light chains elevated 26.31, Mspike 0.4. Results today are pending. Patient will return to clinic in one year with labs one week prior to visit. We will call if pending results require a sooner follow up with the patient.    Patient expressed understanding and was in agreement with this plan. He also understands that He can call clinic at any time with any questions, concerns, or complaints.   Dr. Grayland Ormond was available for consultation and review of plan of care for  this patient.   Mayra Reel, NP   10/10/2015 4:16 PM

## 2015-10-11 LAB — PROTEIN ELECTROPHORESIS, SERUM
A/G RATIO SPE: 1.4 (ref 0.7–1.7)
Albumin ELP: 3.9 g/dL (ref 2.9–4.4)
Alpha-1-Globulin: 0.1 g/dL (ref 0.0–0.4)
Alpha-2-Globulin: 0.8 g/dL (ref 0.4–1.0)
Beta Globulin: 1 g/dL (ref 0.7–1.3)
GLOBULIN, TOTAL: 2.7 g/dL (ref 2.2–3.9)
Gamma Globulin: 0.7 g/dL (ref 0.4–1.8)
M-Spike, %: 0.2 g/dL — ABNORMAL HIGH
TOTAL PROTEIN ELP: 6.6 g/dL (ref 6.0–8.5)

## 2015-10-11 LAB — KAPPA/LAMBDA LIGHT CHAINS
KAPPA FREE LGHT CHN: 25.4 mg/L — AB (ref 3.3–19.4)
KAPPA, LAMDA LIGHT CHAIN RATIO: 1.81 — AB (ref 0.26–1.65)
LAMDA FREE LIGHT CHAINS: 14 mg/L (ref 5.7–26.3)

## 2016-07-14 ENCOUNTER — Other Ambulatory Visit: Payer: Self-pay | Admitting: Neurology

## 2016-07-14 DIAGNOSIS — R29898 Other symptoms and signs involving the musculoskeletal system: Secondary | ICD-10-CM

## 2016-07-14 DIAGNOSIS — R200 Anesthesia of skin: Secondary | ICD-10-CM

## 2016-07-16 ENCOUNTER — Ambulatory Visit (INDEPENDENT_AMBULATORY_CARE_PROVIDER_SITE_OTHER): Payer: Medicare Other | Admitting: Psychiatry

## 2016-07-16 ENCOUNTER — Encounter: Payer: Self-pay | Admitting: Psychiatry

## 2016-07-16 DIAGNOSIS — F0283 Dementia in other diseases classified elsewhere, unspecified severity, with mood disturbance: Secondary | ICD-10-CM

## 2016-07-16 DIAGNOSIS — G309 Alzheimer's disease, unspecified: Secondary | ICD-10-CM | POA: Diagnosis not present

## 2016-07-16 DIAGNOSIS — F0280 Dementia in other diseases classified elsewhere without behavioral disturbance: Secondary | ICD-10-CM

## 2016-07-16 DIAGNOSIS — F329 Major depressive disorder, single episode, unspecified: Secondary | ICD-10-CM | POA: Diagnosis not present

## 2016-07-16 MED ORDER — BUSPIRONE HCL 5 MG PO TABS: 5.0000 mg | ORAL_TABLET | Freq: Every day | ORAL | 1 refills | Status: DC

## 2016-07-16 NOTE — Progress Notes (Signed)
Psychiatric Initial Adult Assessment   Patient Identification: Kurt Miller MRN:  [id number] Date of Evaluation:  07/16/2016 Referral Source: [facility]  Chief Complaint:   Visit Diagnosis:    ICD-9-CM ICD-10-CM   1. 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 He has history of dementia and his memory has been getting worse. Patient has word finding difficulty. He reported that he does not remember and anything and has difficulty communicating. He reported that he is here with his wife, however he feels that his mind is empty and does not know the reason for this interview. Most of the history was provided by his wife. She reported that patient has history of bipolar disorder in the past. She reported that he used to have manic episodes and was restarted on Seroquel by his [place] psychiatrist and the dose was gradually increased to 800 mg. He currently takes 600 mg at bedtime and 100 twice daily. He is stable on the dose and he is sleeping well at night. He also takes Celexa 10 mg in the morning. He was recently started on Aricept by his neurologist. She reported that patient is not interested in doing any activities to help with his memory as he gets frustrated and agitated quickly. She reported that he continues to have episodes of " behavior problems"the evening around 3-4  PM in which he will become frustrated quickly and wants to go out. He does not know what he wants to do. She reported that she has tried several activities but he is not interested. He wants to sit in a place where he can and watch other people. 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  Patient  currently denied having any suicidal homicidal ideations or plans. He denied having any perceptual disturbances.  Associated Signs/Symptoms: Depression Symptoms:  anhedonia, psychomotor retardation, fatigue, impaired memory, anxiety, (Hypo) Manic Symptoms:  Labiality of Mood, Anxiety Symptoms:  Excessive Worry, Psychotic Symptoms:  none PTSD Symptoms: Negative NA  Past Psychiatric History: Bipolar Disorder  Previous Psychotropic Medications: Lithium Geodon Namenda zoloft  Substance Abuse History in the last 12 months:  No.  Consequences of Substance Abuse: Negative NA  Past Medical History:  Past Medical History:  Diagnosis Date  . Anemia   . Anxiety   . Cancer    Kidney  . Chronic kidney disease   . Depression   . Hypertension    No past surgical history on file.  Family Psychiatric History:  Brother- ADHD Son- Bipolar Son- Depression  Family History:  Family History  Problem Relation Age of Onset  . Cancer Mother   . Diabetes Mother   . Heart attack Father   . Hypertension Brother   . Stroke Brother   . Hypertension Brother     Social History:   Social History   Social History  . Marital status: Married    Spouse name: N/A  . Number of children: N/A  . Years of education: N/A   Social History Main Topics  . Smoking status: Former Smoker    Packs/day: 0.25    Years: 10.00    Types: Cigarettes  Quit date: 05/12/1984  . Smokeless tobacco: Never Used  . Alcohol use No  . Drug use: No  . Sexual activity: Not on file   Other Topics Concern  . Not on file   Social History Narrative  . No narrative on file    Additional Social History:  Married x  4 times. Currently for 26 years. Has 4 children.   Allergies:   Allergies  Allergen Reactions  . Acetaminophen Other (See Comments)    Pancreatitis  . Famotidine Other (See Comments)    Pancreatitis    Metabolic Disorder Labs: No results found for: HGBA1C, MPG No results found  for: PROLACTIN Lab Results  Component Value Date   CHOL 139 04/14/2014   TRIG 41 04/14/2014   HDL 58 04/14/2014   VLDL 8 04/14/2014   LDLCALC 73 04/14/2014     Current Medications: Current Outpatient Prescriptions  Medication Sig Dispense Refill  . amLODipine (NORVASC) 10 MG tablet Take 10 mg by mouth daily.     Marland Kitchen aspirin 81 MG tablet Take by mouth.    . Cholecalciferol (VITAMIN D3) 2000 units TABS Take 1 tablet by mouth daily.    . Cinnamon 500 MG capsule Take by mouth.    . citalopram (CELEXA) 10 MG tablet TAKE 1 TABLET DAILY    . donepezil (ARICEPT) 10 MG tablet TAKE 1 TABLET EVERY NIGHT    . finasteride (PROSCAR) 5 MG tablet TAKE 1 TABLET DAILY    . fluticasone (FLONASE) 50 MCG/ACT nasal spray     . Glycerin, Laxative, (RA GLYCERIN ADULT) 80.7 % SUPP Reported on 10/10/2015    . lisinopril (PRINIVIL,ZESTRIL) 40 MG tablet TAKE 1 TABLET EVERY MORNING FOR BLOOD PRESSURE    . Magnesium Oxide 500 MG (LAX) TABS Take 1 tablet by mouth daily.    . Melatonin 5 MG CAPS Take 1 capsule by mouth at bedtime as needed.    . Mesalamine (ASACOL HD) 800 MG TBEC TAKE 2 TABLETS TWICE A DAY    . Multiple Vitamins-Minerals (SENIOR MULTIVITAMIN PLUS PO) Take by mouth.    . Omega-3 Fatty Acids (FISH OIL) 1000 MG CAPS Take by mouth.    Marland Kitchen omeprazole (PRILOSEC) 40 MG capsule TAKE 1 CAPSULE DAILY    . pravastatin (PRAVACHOL) 20 MG tablet TAKE 1 TABLET DAILY    . QUEtiapine (SEROQUEL) 100 MG tablet Take 100 mg by mouth 2 (two) times daily.     . sodium bicarbonate 650 MG tablet 650 mg 2 (two) times daily.    . tamsulosin (FLOMAX) 0.4 MG CAPS capsule TAKE 1 CAPSULE DAILY    . vitamin E (VITAMIN E) 1000 UNIT capsule Take 1,000 Units by mouth daily.     No current facility-administered medications for this visit.     Neurologic: Headache: Yes Seizure: No Paresthesias:No  Musculoskeletal: Strength & Muscle Tone: within normal limits Gait & Station: normal Patient leans: N/A  Psychiatric Specialty  Exam: ROS  There were no vitals taken for this visit.There is no height or weight on file to calculate BMI.  Miller Appearance: Casual  Eye Contact:  Fair  Speech:  Clear and Coherent  Volume:  Decreased  Mood:  Anxious  Affect:  Blunt  Thought Process:  Coherent  Orientation:  Full (Time, Place, and Person)  Thought Content:  WDL  Suicidal Thoughts:  No  Homicidal Thoughts:  No  Memory:  Immediate;   Poor Recent;   Fair Remote;   Fair  Judgement:  Intact  Insight:  Lacking  Psychomotor Activity:  Normal  Concentration:  Concentration: Poor and Attention Span: Poor  Recall:  [place]  Language: Fair  Akathisia:  No  Handed:  Right  AIMS (if indicated):    Assets:  Communication Skills Physical Health Social Support  ADL's:  Intact  Cognition: Impaired,  Mild  Sleep:      Treatment Plan Summary: Medication management   Discussed with his wife at length about the medications treatment risks benefits and alternatives. I was continue his medications including Seroquel Celexa and Aricept as prescribed. I will decrease the dose of Seroquel 50 mg in the afternoon He will continue on 600 mg at bedtime. Completed AIMS Patient will be started on BuSpar 5 mg at 2 PM to help with his anxiety symptoms. He will follow-up in one month or earlier depending on his symptoms   More than 50% of the time spent in psychoeducation, counseling and coordination of care.    This note was generated in part or whole with voice recognition software. Voice regonition is usually quite accurate but there are transcription errors that can and very often do occur. I apologize for any typographical errors that were not detected and corrected.    Rainey Pines, MD 4/4/201812:58 PM

## 2016-07-24 ENCOUNTER — Ambulatory Visit
Admission: RE | Admit: 2016-07-24 | Discharge: 2016-07-24 | Disposition: A | Discharge status: Home / Self Care | Payer: Medicare Other | Source: Ambulatory Visit | Attending: Neurology | Admitting: Neurology

## 2016-07-24 DIAGNOSIS — R29898 Other symptoms and signs involving the musculoskeletal system: Secondary | ICD-10-CM | POA: Diagnosis not present

## 2016-07-24 DIAGNOSIS — R200 Anesthesia of skin: Secondary | ICD-10-CM | POA: Diagnosis present

## 2016-07-24 DIAGNOSIS — M50321 Other cervical disc degeneration at C4-C5 level: Secondary | ICD-10-CM | POA: Diagnosis not present

## 2016-07-24 DIAGNOSIS — M4802 Spinal stenosis, cervical region: Secondary | ICD-10-CM | POA: Insufficient documentation

## 2016-08-18 ENCOUNTER — Ambulatory Visit (INDEPENDENT_AMBULATORY_CARE_PROVIDER_SITE_OTHER): Payer: Medicare Other | Admitting: Psychiatry

## 2016-08-18 ENCOUNTER — Encounter: Payer: Self-pay | Admitting: Psychiatry

## 2016-08-18 VITALS — BP 131/81 | HR 85 | Temp 98.0°F | Wt 235.4 lb

## 2016-08-18 DIAGNOSIS — F0280 Dementia in other diseases classified elsewhere without behavioral disturbance: Secondary | ICD-10-CM

## 2016-08-18 DIAGNOSIS — G309 Alzheimer's disease, unspecified: Secondary | ICD-10-CM | POA: Diagnosis not present

## 2016-08-18 DIAGNOSIS — F329 Major depressive disorder, single episode, unspecified: Secondary | ICD-10-CM | POA: Diagnosis not present

## 2016-08-18 MED ORDER — QUETIAPINE FUMARATE 50 MG PO TABS: 50.0000 mg | ORAL_TABLET | Freq: Every day | ORAL | 1 refills | Status: DC

## 2016-08-18 MED ORDER — BUSPIRONE HCL 10 MG PO TABS: 10.0000 mg | ORAL_TABLET | Freq: Every day | ORAL | 1 refills | Status: DC

## 2016-08-18 MED ORDER — QUETIAPINE FUMARATE 200 MG PO TABS: 400.0000 mg | ORAL_TABLET | Freq: Every day | ORAL | 2 refills | Status: DC

## 2016-08-18 NOTE — Progress Notes (Signed)
Psychiatric MD Progress Note  Patient Identification: Kurt Miller MRN:  [id number] Date of Evaluation:  08/18/2016 Referral Source: [facility]  Chief Complaint:   Chief Complaint    Follow-up; Medication Refill     Visit Diagnosis:    ICD-9-CM ICD-10-CM   1. 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 He has history of dementia and his memory has been getting worse. Patient has word finding difficulty. He reported that he does not remember anything and has difficulty communicating. He reported that he is here with his wife, however he feels that his mind is empty and does not know the reason for this interview. Most of the history was provided by his wife.  Patient reported that he spends most of the time at home and does not feel motivated to do anything. He just sits there. He reported that she is just spending his life. He thinks he might be depressed but does not want to elaborate. He does not feel motivated to go out and do anything. His wife thinks that he is not interested in watching TV. We have decreased the dose of Seroquel and his wife thinks that he has more energy. He is still sleeping after taking his morning medications. Discussed about adjusting his medications and patient and his wife are receptive. Patient stated that he does not have any hobbies. 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 He stated that computer seems to complicated for him. He does not have any hobbies and the wife is thinking about taking him to the senior center [facility] [place]. We discussed about several different options but patient does not seems to be motivated at this time.    Patient currently denied having any suicidal homicidal ideations or  plans. He denied having any perceptual disturbances.  Associated Signs/Symptoms: Depression Symptoms:  anhedonia, psychomotor retardation, fatigue, impaired memory, anxiety, (Hypo) Manic Symptoms:  Labiality of Mood, Anxiety Symptoms:  Excessive Worry, Psychotic Symptoms:  none PTSD Symptoms: Negative NA  Past Psychiatric History: Bipolar Disorder  Previous Psychotropic Medications: Lithium Geodon Namenda zoloft  Substance Abuse History in the last 12 months:  No.  Consequences of Substance Abuse: Negative NA  Past Medical History:  Past Medical History:  Diagnosis Date  . Anemia   . Anxiety   . Cancer ([place])    Kidney  . Chronic kidney disease   . Depression   . Hypertension     Past Surgical History:  Procedure Laterality Date  . KIDNEY SURGERY    . NOSE SURGERY    . TONSILLECTOMY      Family Psychiatric History:  Brother- ADHD Son- Bipolar Son- Depression  Family History:  Family History  Problem Relation Age of Onset  . Cancer Mother   . Diabetes Mother   . Heart attack Father   . Hypertension Brother   . Stroke Brother   . ADD / ADHD Brother   . Hypertension Brother     Social History:   Social History   Social History  . Marital status: Married    Spouse name: N/A  . Number of children: N/A  . Years of education: N/A   Social History Main Topics  . Smoking status: Former  Smoker    Packs/day: 0.25    Years: 10.00    Types: Cigarettes    Quit date: 05/12/1984  . Smokeless tobacco: Never Used  . Alcohol use No  . Drug use: No  . Sexual activity: Not Currently   Other Topics Concern  . None   Social History Narrative  . None    Additional Social History:  Married x  4 times. Currently for 26 years. Has 4 children.   Allergies:   Allergies  Allergen Reactions  . Acetaminophen Other (See Comments)    Pancreatitis  . Famotidine Other (See Comments)    Pancreatitis    Metabolic Disorder Labs: No results found for:  HGBA1C, MPG No results found for: PROLACTIN Lab Results  Component Value Date   CHOL 139 04/14/2014   TRIG 41 04/14/2014   HDL 58 04/14/2014   VLDL 8 04/14/2014   LDLCALC 73 04/14/2014     Current Medications: Current Outpatient Prescriptions  Medication Sig Dispense Refill  . amLODipine (NORVASC) 10 MG tablet Take 10 mg by mouth daily.     Marland Kitchen aspirin 81 MG tablet Take by mouth.    . busPIRone (BUSPAR) 5 MG tablet Take 1 tablet (5 mg total) by mouth daily at 2 PM. 30 tablet 1  . Cholecalciferol (VITAMIN D3) 2000 units TABS Take 1 tablet by mouth daily.    . Cinnamon 500 MG capsule Take by mouth.    . citalopram (CELEXA) 10 MG tablet TAKE 1 TABLET DAILY    . donepezil (ARICEPT) 10 MG tablet TAKE 1 TABLET EVERY NIGHT    . finasteride (PROSCAR) 5 MG tablet TAKE 1 TABLET DAILY    . fluticasone (FLONASE) 50 MCG/ACT nasal spray     . Glycerin, Laxative, (RA GLYCERIN ADULT) 80.7 % SUPP Reported on 10/10/2015    . lisinopril (PRINIVIL,ZESTRIL) 40 MG tablet TAKE 1 TABLET EVERY MORNING FOR BLOOD PRESSURE    . Magnesium Oxide 500 MG (LAX) TABS Take 1 tablet by mouth daily.    . Melatonin 5 MG CAPS Take 1 capsule by mouth at bedtime as needed.    . Mesalamine (ASACOL HD) 800 MG TBEC TAKE 2 TABLETS TWICE A DAY    . Multiple Vitamins-Minerals (SENIOR MULTIVITAMIN PLUS PO) Take by mouth.    . Omega-3 Fatty Acids (FISH OIL) 1000 MG CAPS Take by mouth.    Marland Kitchen omeprazole (PRILOSEC) 40 MG capsule TAKE 1 CAPSULE DAILY    . pravastatin (PRAVACHOL) 20 MG tablet TAKE 1 TABLET DAILY    . QUEtiapine (SEROQUEL) 100 MG tablet Take 100 mg by mouth 2 (two) times daily.     . QUEtiapine (SEROQUEL) 200 MG tablet 3 tabs at bedtime PO    . sodium bicarbonate 650 MG tablet 650 mg 2 (two) times daily.    . tamsulosin (FLOMAX) 0.4 MG CAPS capsule TAKE 1 CAPSULE DAILY    . vitamin E (VITAMIN E) 1000 UNIT capsule Take 1,000 Units by mouth daily.     No current facility-administered medications for this visit.      Neurologic: Headache: Yes Seizure: No Paresthesias:No  Musculoskeletal: Strength & Muscle Tone: within normal limits Gait & Station: normal Patient leans: N/A  Psychiatric Specialty Exam: ROS  Blood pressure 131/81, pulse 85, temperature 98 F (36.7 C), temperature source Oral, weight 235 lb 6.4 oz (106.8 kg).Body mass index is 33.78 kg/m.  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 I will decrease the dose of Seroquel 400 mg at bedtime and 50 mg in the afternoon. I will titrate the dose of BuSpar 10 mg in the afternoon. Continue on Celexa and Aricept as prescribed   He will follow-up in one month or earlier depending on his symptoms   More than 50% of the time spent in psychoeducation, counseling and coordination of care.    This note was generated in part or whole with voice recognition software. Voice regonition is usually quite accurate but there are transcription errors that can and very often do occur. I apologize for any typographical errors that were not detected and corrected.    Rainey Pines, MD 5/7/20182:46 PM

## 2016-09-17 ENCOUNTER — Ambulatory Visit: Payer: Medicare Other | Admitting: Psychiatry

## 2016-10-06 ENCOUNTER — Ambulatory Visit (INDEPENDENT_AMBULATORY_CARE_PROVIDER_SITE_OTHER): Payer: Medicare Other | Admitting: Psychiatry

## 2016-10-06 ENCOUNTER — Encounter: Payer: Self-pay | Admitting: Psychiatry

## 2016-10-06 VITALS — BP 137/82 | HR 82 | Temp 98.0°F | Wt 229.2 lb

## 2016-10-06 DIAGNOSIS — F329 Major depressive disorder, single episode, unspecified: Secondary | ICD-10-CM | POA: Diagnosis not present

## 2016-10-06 DIAGNOSIS — D472 Monoclonal gammopathy: Secondary | ICD-10-CM | POA: Diagnosis not present

## 2016-10-06 DIAGNOSIS — F0280 Dementia in other diseases classified elsewhere without behavioral disturbance: Secondary | ICD-10-CM

## 2016-10-06 DIAGNOSIS — G309 Alzheimer's disease, unspecified: Secondary | ICD-10-CM | POA: Diagnosis not present

## 2016-10-06 MED ORDER — QUETIAPINE FUMARATE 200 MG PO TABS: 400.0000 mg | ORAL_TABLET | Freq: Every day | ORAL | 2 refills | Status: DC

## 2016-10-06 MED ORDER — QUETIAPINE FUMARATE 50 MG PO TABS: 50.0000 mg | ORAL_TABLET | Freq: Every day | ORAL | 1 refills | Status: DC

## 2016-10-06 MED ORDER — DONEPEZIL HCL 10 MG PO TABS: 10.0000 mg | ORAL_TABLET | Freq: Every day | ORAL | 3 refills | Status: DC

## 2016-10-06 MED ORDER — LAMOTRIGINE 25 MG PO TABS: 25.0000 mg | ORAL_TABLET | Freq: Every day | ORAL | 1 refills | Status: DC

## 2016-10-06 MED ORDER — CITALOPRAM HYDROBROMIDE 10 MG PO TABS: 10.0000 mg | ORAL_TABLET | Freq: Every day | ORAL | 1 refills | Status: DC

## 2016-10-06 NOTE — Progress Notes (Signed)
Psychiatric MD Progress Note  Patient Identification: Kurt Miller MRN:  [id number] Date of Evaluation:  10/06/2016 Referral Source: [facility]  Chief Complaint:   Chief Complaint    Follow-up; Medication Refill     Visit Diagnosis:    ICD-10-CM   1. 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 He has history of dementia and his memory has been getting worse. Patient has word finding difficulty. His wife reported that he continues to show improvements as the dose of his Seroquel was decreased. However patient appears to be restless and wants his life to go out every evening. She reported that they have tried several different things but he seems to lose interest quickly. She reported that [facility] but he is not interested. She reported that he has difficulty sleeping at night as well. He has previous history of bipolar in the past. She reported that he has never been tried on lamotrigine the past. We discussed about changing his medications. He agreed with the plan. Patient currently denied having any suicidal homicidal ideations or plans. His wife is supportive.   He reported that he does not remember anything and has difficulty communicating. He reported that he is here with his wife, however he feels that his mind is empty and does not know the reason for this interview. Most of the history was provided by his wife.    Associated Signs/Symptoms: Depression Symptoms:  anhedonia, psychomotor retardation, fatigue, impaired memory, anxiety, (Hypo) Manic Symptoms:  Labiality of Mood, Anxiety Symptoms:  Excessive Worry, Psychotic Symptoms:  none PTSD Symptoms: Negative NA  Past Psychiatric History: Bipolar  Disorder  Previous Psychotropic Medications: Lithium Geodon Namenda zoloft  Substance Abuse History in the last 12 months:  No.  Consequences of Substance Abuse: Negative NA  Past Medical History:  Past Medical History:  Diagnosis Date  . Anemia   . Anxiety   . Cancer ([place])    Kidney  . Chronic kidney disease   . Depression   . Hypertension     Past Surgical History:  Procedure Laterality Date  . KIDNEY SURGERY    . NOSE SURGERY    . TONSILLECTOMY      Family Psychiatric History:  Brother- ADHD Son- Bipolar Son- Depression  Family History:  Family History  Problem Relation Age of Onset  . Cancer Mother   . Diabetes Mother   . Heart attack Father   . Hypertension Brother   . Stroke Brother   . ADD / ADHD Brother   . Hypertension Brother     Social History:   Social History   Social History  . Marital status: Married    Spouse name: N/A  . Number of children: N/A  . Years of education: N/A   Social History Main Topics  . Smoking status: Former Smoker    Packs/day: 0.25    Years: 10.00    Types: Cigarettes    Quit date: 05/12/1984  . Smokeless tobacco: Never Used  . Alcohol use No  . Drug use: No  . Sexual activity: Not Currently   Other Topics Concern  . None   Social History Narrative  . None    Additional Social History:  Married x  4 times. Currently for 26 years. Has 4 children.   Allergies:   Allergies  Allergen Reactions  . Acetaminophen Other (See Comments)    Pancreatitis  . Famotidine Other (See Comments)    Pancreatitis    Metabolic Disorder Labs: No results found for: HGBA1C, MPG No results found for: PROLACTIN Lab Results  Component Value Date   CHOL 139 04/14/2014   TRIG 41 04/14/2014   HDL 58 04/14/2014   VLDL 8 04/14/2014   LDLCALC 73 04/14/2014     Current Medications: Current Outpatient Prescriptions  Medication Sig Dispense Refill  . amLODipine (NORVASC) 10 MG tablet Take 10 mg by mouth daily.      Marland Kitchen aspirin 81 MG tablet Take by mouth.    . busPIRone (BUSPAR) 10 MG tablet Take 1 tablet (10 mg total) by mouth daily at 2 PM. 90 tablet 1  . Cholecalciferol (VITAMIN D3) 2000 units TABS Take 1 tablet by mouth daily.    . Cinnamon 500 MG capsule Take by mouth.    . citalopram (CELEXA) 10 MG tablet TAKE 1 TABLET DAILY    . donepezil (ARICEPT) 10 MG tablet TAKE 1 TABLET EVERY NIGHT    . finasteride (PROSCAR) 5 MG tablet TAKE 1 TABLET DAILY    . fluticasone (FLONASE) 50 MCG/ACT nasal spray     . Glycerin, Laxative, (RA GLYCERIN ADULT) 80.7 % SUPP Reported on 10/10/2015    . lisinopril (PRINIVIL,ZESTRIL) 40 MG tablet TAKE 1 TABLET EVERY MORNING FOR BLOOD PRESSURE    . Magnesium Oxide 500 MG (LAX) TABS Take 1 tablet by mouth daily.    . Melatonin 5 MG CAPS Take 1 capsule by mouth at bedtime as needed.    . Mesalamine (ASACOL HD) 800 MG TBEC TAKE 2 TABLETS TWICE A DAY    . Multiple Vitamins-Minerals (SENIOR MULTIVITAMIN PLUS PO) Take by mouth.    . Omega-3 Fatty Acids (FISH OIL) 1000 MG CAPS Take by mouth.    Marland Kitchen omeprazole (PRILOSEC) 40 MG capsule TAKE 1 CAPSULE DAILY    . pravastatin (PRAVACHOL) 20 MG tablet TAKE 1 TABLET DAILY    . QUEtiapine (SEROQUEL) 200 MG tablet Take 2 tablets (400 mg total) by mouth at bedtime. 60 tablet 2  . QUEtiapine (SEROQUEL) 50 MG tablet Take 1 tablet (50 mg total) by mouth daily at 2 am. Pt has supply 30 tablet 1  . sodium bicarbonate 650 MG tablet 650 mg 2 (two) times daily.    . tamsulosin (FLOMAX) 0.4 MG CAPS capsule TAKE 1 CAPSULE DAILY    . vitamin E (VITAMIN E) 1000 UNIT capsule Take 1,000 Units by mouth daily.     No current facility-administered medications for this visit.     Neurologic: Headache: Yes Seizure: No Paresthesias:No  Musculoskeletal: Strength & Muscle Tone: within normal limits Gait & Station: normal Patient leans: N/A  Psychiatric Specialty Exam: ROS  Blood pressure 137/82, pulse 82, temperature 98 F (36.7 C), temperature source  Oral, weight 229 lb 3.2 oz (104 kg).Body mass index is 32.89 kg/m.  General Appearance: Casual  Eye Contact:  Fair  Speech:  Clear and Coherent  Volume:  Decreased  Mood:  Anxious  Affect:  Blunt  Thought Process:  Coherent  Orientation:  Full (Time, Place, and Person)  Thought Content:  WDL  Suicidal Thoughts:  No  Homicidal Thoughts:  No  Memory:  Immediate;   Poor Recent;   Fair Remote;   Fair  Judgement:  Intact  Insight:  Lacking  Psychomotor Activity:  Normal  Concentration:  Concentration: Poor and Attention Span: Poor  Recall:  [employer] of Knowledge:Fair  Language: Fair  Akathisia:  No  Handed:  Right  AIMS (if indicated):    Assets:  Communication Skills Physical Health Social Support  ADL's:  Intact  Cognition: Impaired,  Mild  Sleep:      Treatment Plan Summary: Medication management   Discussed with his wife at length about the medications treatment risks benefits and alternatives. I will continue  the dose of Seroquel 400 mg at bedtime and Discontinued the 50 mg in the afternoon.  BuSpar 10 mg in the afternoon. Continue on Celexa and Aricept as prescribed I will start him on lamotrigine 25 mg daily. Discussed with him about side effects of the medication including the risk of rash and he demonstrated understanding.   He will follow-up in one month or earlier depending on his symptoms   More than 50% of the time spent in psychoeducation, counseling and coordination of care.    This note was generated in part or whole with voice recognition software. Voice regonition is usually quite accurate but there are transcription errors that can and very often do occur. I apologize for any typographical errors that were not detected and corrected.    Rainey Pines, MD 6/25/20183:11 PM

## 2016-10-09 ENCOUNTER — Inpatient Hospital Stay: Payer: Medicare Other | Attending: Oncology

## 2016-10-09 DIAGNOSIS — D509 Iron deficiency anemia, unspecified: Secondary | ICD-10-CM | POA: Diagnosis not present

## 2016-10-09 DIAGNOSIS — D472 Monoclonal gammopathy: Secondary | ICD-10-CM | POA: Diagnosis not present

## 2016-10-09 LAB — CBC WITH DIFFERENTIAL/PLATELET
Basophils Absolute: 0 10*3/uL (ref 0–0.1)
Basophils Relative: 1 %
EOS ABS: 0 10*3/uL (ref 0–0.7)
EOS PCT: 0 %
HCT: 39.9 % — ABNORMAL LOW (ref 40.0–52.0)
Hemoglobin: 14.2 g/dL (ref 13.0–18.0)
LYMPHS ABS: 2.3 10*3/uL (ref 1.0–3.6)
LYMPHS PCT: 44 %
MCH: 31.6 pg (ref 26.0–34.0)
MCHC: 35.6 g/dL (ref 32.0–36.0)
MCV: 88.8 fL (ref 80.0–100.0)
MONOS PCT: 10 %
Monocytes Absolute: 0.5 10*3/uL (ref 0.2–1.0)
Neutro Abs: 2.4 10*3/uL (ref 1.4–6.5)
Neutrophils Relative %: 45 %
PLATELETS: 239 10*3/uL (ref 150–440)
RBC: 4.49 MIL/uL (ref 4.40–5.90)
RDW: 13.5 % (ref 11.5–14.5)
WBC: 5.2 10*3/uL (ref 3.8–10.6)

## 2016-10-09 LAB — IRON AND TIBC
Iron: 107 ug/dL (ref 45–182)
SATURATION RATIOS: 27 % (ref 17.9–39.5)
TIBC: 396 ug/dL (ref 250–450)
UIBC: 289 ug/dL

## 2016-10-09 LAB — FERRITIN: FERRITIN: 20 ng/mL — AB (ref 24–336)

## 2016-10-14 NOTE — Progress Notes (Addendum)
[place]  [phone] [phone]) [phone]  ID: Kurt Miller OB: 1940/12/17  MR#: [id number]  JIR#:[id number]  Patient Care Team: Madelyn Brunner, MD as PCP - General (Internal Medicine)  CHIEF COMPLAINT: Iron deficiency anemia, MGUS  INTERVAL HISTORY: Patient is a 76 year old male who returns to clinic for yearly follow-up and laboratory work. He has some memory loss and defers to his wife to answer much of the questions, but currently feels well and is asymptomatic. He has no neurologic complaints. He denies any recent fevers or illnesses. He has a good appetite and denies weight loss. He denies any pain. He has no chest pain or shortness of breath. He denies any nausea, vomiting, constipation, or diarrhea. He has no urinary complaints. Patient feels at his baseline and offers no specific complaints today.  REVIEW OF SYSTEMS:   Review of Systems  Constitutional: Negative.  Negative for fever, malaise/fatigue and weight loss.  Respiratory: Negative.  Negative for cough and shortness of breath.   Cardiovascular: Negative.  Negative for chest pain and leg swelling.  Gastrointestinal: Negative.  Negative for abdominal pain.  Genitourinary: Negative.   Musculoskeletal: Negative.  Negative for back pain.  Skin: Negative.  Negative for rash.  Neurological: Negative.  Negative for sensory change and weakness.  Psychiatric/Behavioral: Positive for memory loss. The patient is not nervous/anxious.     As per HPI. Otherwise, a complete review of systems is negative.  PAST MEDICAL HISTORY: Past Medical History:  Diagnosis Date  . Anemia   . Anxiety   . Cancer ([place])    Kidney  . Chronic kidney disease   . Depression   . Hypertension     PAST SURGICAL HISTORY: Past Surgical History:  Procedure Laterality Date  . KIDNEY SURGERY    . NOSE SURGERY    . TONSILLECTOMY      FAMILY HISTORY: Family History  Problem Relation Age of Onset  . Cancer  Mother   . Diabetes Mother   . Heart attack Father   . Hypertension Brother   . Stroke Brother   . ADD / ADHD Brother   . Hypertension Brother     ADVANCED DIRECTIVES (Y/N):  N  HEALTH MAINTENANCE: Social History  Substance Use Topics  . Smoking status: Former Smoker    Packs/day: 0.25    Years: 10.00    Types: Cigarettes    Quit date: 05/12/1984  . Smokeless tobacco: Never Used  . Alcohol use No     Colonoscopy:  PAP:  Bone density:  Lipid panel:  Allergies  Allergen Reactions  . Acetaminophen Other (See Comments)    Pancreatitis  . Famotidine Other (See Comments)    Pancreatitis    Current Outpatient Prescriptions  Medication Sig Dispense Refill  . amLODipine (NORVASC) 10 MG tablet Take 10 mg by mouth daily.     Marland Kitchen aspirin 81 MG tablet Take by mouth.    . busPIRone (BUSPAR) 10 MG tablet Take 1 tablet (10 mg total) by mouth daily at 2 PM. 90 tablet 1  . Cholecalciferol (VITAMIN D3) 2000 units TABS Take 1 tablet by mouth daily.    . Cinnamon 500 MG capsule Take by mouth.    . citalopram (CELEXA) 10 MG tablet Take 1 tablet (10 mg total) by mouth daily. 90 tablet 1  . donepezil (ARICEPT) 10 MG tablet Take 1 tablet (10 mg total) by mouth at bedtime. 30 tablet 3  . finasteride (PROSCAR) 5 MG tablet TAKE 1 TABLET DAILY    .  fluticasone (FLONASE) 50 MCG/ACT nasal spray     . Glycerin, Laxative, (RA GLYCERIN ADULT) 80.7 % SUPP Reported on 10/10/2015    . lamoTRIgine (LAMICTAL) 25 MG tablet Take 1 tablet (25 mg total) by mouth daily. 30 tablet 1  . lisinopril (PRINIVIL,ZESTRIL) 40 MG tablet TAKE 1 TABLET EVERY MORNING FOR BLOOD PRESSURE    . Magnesium Oxide 500 MG (LAX) TABS Take 1 tablet by mouth daily.    . Melatonin 5 MG CAPS Take 1 capsule by mouth at bedtime as needed.    . Mesalamine (ASACOL HD) 800 MG TBEC TAKE 2 TABLETS TWICE A DAY    . Multiple Vitamins-Minerals (SENIOR MULTIVITAMIN PLUS PO) Take by mouth.    . Omega-3 Fatty Acids (FISH OIL) 1000 MG CAPS Take by  mouth.    Marland Kitchen omeprazole (PRILOSEC) 40 MG capsule TAKE 1 CAPSULE DAILY    . pravastatin (PRAVACHOL) 20 MG tablet TAKE 1 TABLET DAILY    . QUEtiapine (SEROQUEL) 200 MG tablet Take 2 tablets (400 mg total) by mouth at bedtime. 180 tablet 2  . QUEtiapine (SEROQUEL) 50 MG tablet Take 1 tablet (50 mg total) by mouth daily at 2 am. 30 tablet 1  . sodium bicarbonate 650 MG tablet 650 mg 2 (two) times daily.    . tamsulosin (FLOMAX) 0.4 MG CAPS capsule TAKE 1 CAPSULE DAILY    . vitamin E (VITAMIN E) 1000 UNIT capsule Take 1,000 Units by mouth daily.     No current facility-administered medications for this visit.     OBJECTIVE: Vitals:   10/16/16 1450  BP: 124/74  Pulse: 71  Resp: 20  Temp: (!) 97.5 F (36.4 C)     Body mass index is 32.74 kg/m.    ECOG FS:0 - Asymptomatic  General: Well-developed, well-nourished, no acute distress. Eyes: Pink conjunctiva, anicteric sclera. HEENT: Normocephalic, moist mucous membranes, clear oropharnyx. Lungs: Clear to auscultation bilaterally. Heart: Regular rate and rhythm. No rubs, murmurs, or gallops. Abdomen: Soft, nontender, nondistended. No organomegaly noted, normoactive bowel sounds. Musculoskeletal: No edema, cyanosis, or clubbing. Neuro: Alert, answering all questions appropriately. Cranial nerves grossly intact. Skin: No rashes or petechiae noted. Psych: Normal affect. Lymphatics: No cervical, calvicular, axillary or inguinal LAD.   LAB RESULTS:  Lab Results  Component Value Date   NA 139 10/16/2016   K 3.9 10/16/2016   CL 104 10/16/2016   CO2 27 10/16/2016   GLUCOSE 104 (H) 10/16/2016   BUN 13 10/16/2016   CREATININE 1.75 (H) 10/16/2016   CALCIUM 9.4 10/16/2016   PROT 7.2 04/13/2014   ALBUMIN 3.5 04/13/2014   AST 34 04/13/2014   ALT 44 04/13/2014   ALKPHOS 146 (H) 04/13/2014   BILITOT 0.5 04/13/2014   GFRNONAA 36 (L) 10/16/2016   GFRAA 42 (L) 10/16/2016    Lab Results  Component Value Date   WBC 5.2 10/09/2016    NEUTROABS 2.4 10/09/2016   HGB 14.2 10/09/2016   HCT 39.9 (L) 10/09/2016   MCV 88.8 10/09/2016   PLT 239 10/09/2016   Lab Results  Component Value Date   IRON 107 10/09/2016   TIBC 396 10/09/2016   IRONPCTSAT 27 10/09/2016   Lab Results  Component Value Date   FERRITIN 20 (L) 10/09/2016   Lab Results  Component Value Date   TOTALPROTELP 6.6 10/10/2015   ALBUMINELP 3.9 10/10/2015   A1GS 0.1 10/10/2015   A2GS 0.8 10/10/2015   BETS 1.0 10/10/2015   GAMS 0.7 10/10/2015   MSPIKE 0.2 (H) 10/10/2015  SPEI Comment 10/10/2015     STUDIES: No results found.  ASSESSMENT: Iron deficiency anemia, MGUS  PLAN:    1. Iron deficiency anemia: Patient's hemoglobin and iron stores continue to be within normal limits. No intervention is needed at this time. Continue routine follow-up with primary care as indicated. Return to clinic in 1 year for repeat laboratory work and further evaluation. 2. MGUS: Patient's most recent M spike was 0.2, today's result is pending. Kappa and lambda light chains are also pending. Patient does not have any evidence of endorgan damage. No intervention is needed at this time. Return to clinic in 1 year as above. 3. 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 This was only seen and 9% of leukocytes. In the setting of a normal white blood cell count and patient asymptomatic, no further intervention is needed.  Approximately 30 minutes was spent in discussion of which greater than 50% was consultation.   Patient expressed understanding and was in agreement with this plan. He also understands that He can call clinic at any time with any questions, concerns, or complaints.    Lloyd Huger, MD   10/17/2016 1:42 PM

## 2016-10-16 ENCOUNTER — Inpatient Hospital Stay: Payer: Medicare Other

## 2016-10-16 ENCOUNTER — Inpatient Hospital Stay: Payer: Medicare Other | Attending: Oncology | Admitting: Oncology

## 2016-10-16 VITALS — BP 124/74 | HR 71 | Temp 97.5°F | Resp 20 | Wt 228.2 lb

## 2016-10-16 DIAGNOSIS — Z87891 Personal history of nicotine dependence: Secondary | ICD-10-CM | POA: Insufficient documentation

## 2016-10-16 DIAGNOSIS — I129 Hypertensive chronic kidney disease with stage 1 through stage 4 chronic kidney disease, or unspecified chronic kidney disease: Secondary | ICD-10-CM

## 2016-10-16 DIAGNOSIS — Z7982 Long term (current) use of aspirin: Secondary | ICD-10-CM | POA: Diagnosis not present

## 2016-10-16 DIAGNOSIS — D509 Iron deficiency anemia, unspecified: Secondary | ICD-10-CM

## 2016-10-16 DIAGNOSIS — F418 Other specified anxiety disorders: Secondary | ICD-10-CM | POA: Diagnosis not present

## 2016-10-16 DIAGNOSIS — D472 Monoclonal gammopathy: Secondary | ICD-10-CM

## 2016-10-16 DIAGNOSIS — R413 Other amnesia: Secondary | ICD-10-CM | POA: Insufficient documentation

## 2016-10-16 DIAGNOSIS — Z79899 Other long term (current) drug therapy: Secondary | ICD-10-CM | POA: Diagnosis not present

## 2016-10-16 DIAGNOSIS — N189 Chronic kidney disease, unspecified: Secondary | ICD-10-CM | POA: Insufficient documentation

## 2016-10-16 LAB — BASIC METABOLIC PANEL
ANION GAP: 8 (ref 5–15)
BUN: 13 mg/dL (ref 6–20)
CALCIUM: 9.4 mg/dL (ref 8.9–10.3)
CO2: 27 mmol/L (ref 22–32)
Chloride: 104 mmol/L (ref 101–111)
Creatinine, Ser: 1.75 mg/dL — ABNORMAL HIGH (ref 0.61–1.24)
GFR calc Af Amer: 42 mL/min — ABNORMAL LOW (ref >60)
GFR calc non Af Amer: 36 mL/min — ABNORMAL LOW (ref >60)
GLUCOSE: 104 mg/dL — AB (ref 65–99)
Potassium: 3.9 mmol/L (ref 3.5–5.1)
Sodium: 139 mmol/L (ref 135–145)

## 2016-10-16 NOTE — Progress Notes (Signed)
Patient denies any concerns today.  

## 2016-10-17 LAB — PROTEIN ELECTROPHORESIS, SERUM
A/G Ratio: 1.4 (ref 0.7–1.7)
Albumin ELP: 4.2 g/dL (ref 2.9–4.4)
Alpha-1-Globulin: 0.1 g/dL (ref 0.0–0.4)
Alpha-2-Globulin: 0.9 g/dL (ref 0.4–1.0)
Beta Globulin: 1 g/dL (ref 0.7–1.3)
Gamma Globulin: 0.8 g/dL (ref 0.4–1.8)
Globulin, Total: 2.9 g/dL (ref 2.2–3.9)
M-Spike, %: 0.3 g/dL — ABNORMAL HIGH
Total Protein ELP: 7.1 g/dL (ref 6.0–8.5)

## 2016-10-17 LAB — IGG, IGA, IGM
IGA: 175 mg/dL (ref 61–437)
IgG (Immunoglobin G), Serum: 801 mg/dL (ref 700–1600)
IgM, Serum: 39 mg/dL (ref 15–143)

## 2016-10-17 LAB — KAPPA/LAMBDA LIGHT CHAINS
Kappa free light chain: 20.7 mg/L — ABNORMAL HIGH (ref 3.3–19.4)
Kappa, lambda light chain ratio: 1.41 (ref 0.26–1.65)
Lambda free light chains: 14.7 mg/L (ref 5.7–26.3)

## 2016-10-21 LAB — COMP PANEL: LEUKEMIA/LYMPHOMA

## 2016-11-03 ENCOUNTER — Ambulatory Visit (INDEPENDENT_AMBULATORY_CARE_PROVIDER_SITE_OTHER): Payer: Medicare Other | Admitting: Psychiatry

## 2016-11-03 ENCOUNTER — Encounter: Payer: Self-pay | Admitting: Psychiatry

## 2016-11-03 VITALS — BP 126/70 | HR 85 | Ht 69.5 in | Wt 228.0 lb

## 2016-11-03 DIAGNOSIS — F329 Major depressive disorder, single episode, unspecified: Secondary | ICD-10-CM

## 2016-11-03 DIAGNOSIS — G309 Alzheimer's disease, unspecified: Secondary | ICD-10-CM

## 2016-11-03 DIAGNOSIS — D472 Monoclonal gammopathy: Secondary | ICD-10-CM | POA: Diagnosis not present

## 2016-11-03 DIAGNOSIS — F0280 Dementia in other diseases classified elsewhere without behavioral disturbance: Secondary | ICD-10-CM | POA: Diagnosis not present

## 2016-11-03 MED ORDER — LAMOTRIGINE 25 MG PO TABS: 25.0000 mg | ORAL_TABLET | Freq: Every day | ORAL | 1 refills | Status: DC

## 2016-11-03 MED ORDER — LAMOTRIGINE 25 MG PO TABS
25.0000 mg | ORAL_TABLET | Freq: Every day | ORAL | 1 refills | Status: DC
Start: 2016-11-03 — End: 2016-11-03

## 2016-11-03 NOTE — Progress Notes (Signed)
Psychiatric MD Progress Note  Patient Identification: Kurt Miller MRN:  [id number] Date of Evaluation:  11/03/2016 Referral Source: [facility] Neurology  Chief Complaint:   Chief Complaint    Follow-up     Visit Diagnosis:    ICD-10-CM   1. 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 He has history of dementia.His wife reported that he has started improving and his symptoms are getting better. He continues to have word finding difficulty. His symptoms are getting better. Patient was noted to be laughing during the interview. He does not have any acute symptoms at this time. His wife discussed at length about different activities in which the patient can get involved. Patient is becoming more energetic or alert. He does not have any suicidal homicidal ideations or plans. We discussed about different things patient can do and wife agreed. She is taking care of him at this time.  He does not have any side effects of the medications at this time.    He reported that he does not remember anything and has difficulty communicating. He reported that he is here with his wife, however he feels that his mind is empty and does not know the reason for this interview. Most of the history was provided by his wife.    Associated Signs/Symptoms: Depression Symptoms:  anhedonia, psychomotor retardation, fatigue, impaired memory, anxiety, (Hypo) Manic Symptoms:  Labiality of Mood, Anxiety Symptoms:  Excessive Worry, Psychotic Symptoms:  none PTSD Symptoms: Negative NA  Past Psychiatric History: Bipolar Disorder  Previous Psychotropic Medications: Lithium Geodon Namenda zoloft  Substance Abuse  History in the last 12 months:  No.  Consequences of Substance Abuse: Negative NA  Past Medical History:  Past Medical History:  Diagnosis Date  . Anemia   . Anxiety   . Cancer ([place])    Kidney  . Chronic kidney disease   . Depression   . Hypertension     Past Surgical History:  Procedure Laterality Date  . KIDNEY SURGERY    . NOSE SURGERY    . TONSILLECTOMY      Family Psychiatric History:  Brother- ADHD Son- Bipolar Son- Depression  Family History:  Family History  Problem Relation Age of Onset  . Cancer Mother   . Diabetes Mother   . Heart attack Father   . Hypertension Brother   . Stroke Brother   . ADD / ADHD Brother   . Hypertension Brother     Social History:   Social History   Social History  . Marital status: Married    Spouse name: N/A  . Number of children: N/A  . Years of education: N/A   Social History Main Topics  . Smoking status: Former Smoker    Packs/day: 0.25    Years: 10.00    Types: Cigarettes    Quit date: 05/12/1984  . Smokeless tobacco: Never Used  . Alcohol use No  . Drug use: No  . Sexual activity: Not Currently   Other Topics Concern  . None   Social History Narrative  . None    Additional Social History:  Married x  4 times. Currently for 26 years. Has 4 children.   Allergies:  Allergies  Allergen Reactions  . Acetaminophen Other (See Comments)    Pancreatitis  . Famotidine Other (See Comments)    Pancreatitis    Metabolic Disorder Labs: No results found for: HGBA1C, MPG No results found for: PROLACTIN Lab Results  Component Value Date   CHOL 139 04/14/2014   TRIG 41 04/14/2014   HDL 58 04/14/2014   VLDL 8 04/14/2014   LDLCALC 73 04/14/2014     Current Medications: Current Outpatient Prescriptions  Medication Sig Dispense Refill  . amLODipine (NORVASC) 10 MG tablet Take 10 mg by mouth daily.     Marland Kitchen aspirin 81 MG tablet Take by mouth.    . busPIRone (BUSPAR) 10 MG tablet Take 1 tablet (10 mg  total) by mouth daily at 2 PM. 90 tablet 1  . Cholecalciferol (VITAMIN D3) 2000 units TABS Take 1 tablet by mouth daily.    . Cinnamon 500 MG capsule Take by mouth.    . citalopram (CELEXA) 10 MG tablet Take 1 tablet (10 mg total) by mouth daily. 90 tablet 1  . donepezil (ARICEPT) 10 MG tablet Take 1 tablet (10 mg total) by mouth at bedtime. 30 tablet 3  . finasteride (PROSCAR) 5 MG tablet TAKE 1 TABLET DAILY    . fluticasone (FLONASE) 50 MCG/ACT nasal spray     . Glycerin, Laxative, (RA GLYCERIN ADULT) 80.7 % SUPP Reported on 10/10/2015    . lamoTRIgine (LAMICTAL) 25 MG tablet Take 1 tablet (25 mg total) by mouth daily. 30 tablet 1  . lisinopril (PRINIVIL,ZESTRIL) 40 MG tablet TAKE 1 TABLET EVERY MORNING FOR BLOOD PRESSURE    . Magnesium Oxide 500 MG (LAX) TABS Take 1 tablet by mouth daily.    . Melatonin 5 MG CAPS Take 1 capsule by mouth at bedtime as needed.    . Mesalamine (ASACOL HD) 800 MG TBEC TAKE 2 TABLETS TWICE A DAY    . Multiple Vitamins-Minerals (SENIOR MULTIVITAMIN PLUS PO) Take by mouth.    . Omega-3 Fatty Acids (FISH OIL) 1000 MG CAPS Take by mouth.    Marland Kitchen omeprazole (PRILOSEC) 40 MG capsule TAKE 1 CAPSULE DAILY    . pravastatin (PRAVACHOL) 20 MG tablet TAKE 1 TABLET DAILY    . QUEtiapine (SEROQUEL) 200 MG tablet Take 2 tablets (400 mg total) by mouth at bedtime. 180 tablet 2  . sodium bicarbonate 650 MG tablet 650 mg 2 (two) times daily.    . tamsulosin (FLOMAX) 0.4 MG CAPS capsule TAKE 1 CAPSULE DAILY    . vitamin E (VITAMIN E) 1000 UNIT capsule Take 1,000 Units by mouth daily.     No current facility-administered medications for this visit.     Neurologic: Headache: Yes Seizure: No Paresthesias:No  Musculoskeletal: Strength & Muscle Tone: within normal limits Gait & Station: normal Patient leans: N/A  Psychiatric Specialty Exam: ROS  Blood pressure 126/70, pulse 85, height 5' 9.5" (1.765 m), weight 228 lb (103.4 kg).Body mass index is 33.19 kg/m.  General  Appearance: Casual  Eye Contact:  Fair  Speech:  Clear and Coherent  Volume:  Decreased  Mood:  Anxious  Affect:  Blunt  Thought Process:  Coherent  Orientation:  Full (Time, Place, and Person)  Thought Content:  WDL  Suicidal Thoughts:  No  Homicidal Thoughts:  No  Memory:  Immediate;   Poor Recent;   Fair Remote;   Fair  Judgement:  Intact  Insight:  Lacking  Psychomotor Activity:  Normal  Concentration:  Concentration: Poor and Attention Span: Poor  Recall:  [place]: Fair  Akathisia:  No  Handed:  Right  AIMS (if indicated):    Assets:  Communication Skills Physical Health Social Support  ADL's:  Intact  Cognition: Impaired,  Mild  Sleep:      Treatment Plan Summary: Medication management   Discussed with his wife at length about the medications treatment risks benefits and alternatives. I will continue  the dose of Seroquel 400 mg at bedtime   BuSpar 10 mg at 8 am.  Continue on Celexa and Aricept as prescribed I will start him on lamotrigine 25 mg daily. Discussed with him about side effects of the medication including the risk of rash and he demonstrated understanding.   He will follow-up in 2 month or earlier depending on his symptoms   More than 50% of the time spent in psychoeducation, counseling and coordination of care.    This note was generated in part or whole with voice recognition software. Voice regonition is usually quite accurate but there are transcription errors that can and very often do occur. I apologize for any typographical errors that were not detected and corrected.    Rainey Pines, MD 7/23/20182:59 PM

## 2016-12-29 ENCOUNTER — Ambulatory Visit: Payer: Medicare Other | Admitting: Psychiatry

## 2017-01-12 ENCOUNTER — Other Ambulatory Visit: Payer: Self-pay | Admitting: Psychiatry

## 2017-01-21 ENCOUNTER — Other Ambulatory Visit (HOSPITAL_COMMUNITY): Payer: Self-pay | Admitting: Psychiatry

## 2017-01-21 MED ORDER — LAMOTRIGINE 25 MG PO TABS: 25.0000 mg | ORAL_TABLET | Freq: Every day | ORAL | 0 refills | Status: DC

## 2017-01-21 NOTE — Telephone Encounter (Signed)
Ordered

## 2017-01-21 NOTE — Telephone Encounter (Signed)
pt wife called states pt will not have enough lamictal to do until his appt. pt last seen  on 11-03-16 next appt 02-02-17   lamoTRIgine (LAMICTAL) 25 MG tablet  Medication  Date: 11/03/2016 Department: [facility] Ordering/Authorizing: Rainey Pines, MD  Order Providers   Prescribing Provider Encounter Provider  Rainey Pines, MD Rainey Pines, MD  Medication Detail    Disp Refills Start End   lamoTRIgine (LAMICTAL) 25 MG tablet 30 tablet 1 11/03/2016    Sig - Route: Take 1 tablet (25 mg total) by mouth daily. - Oral   Sent to pharmacy as: lamoTRIgine (LAMICTAL) 25 MG tablet   E-Prescribing Status: Receipt confirmed by pharmacy (11/03/2016 2:53 PM EDT)

## 2017-01-28 ENCOUNTER — Other Ambulatory Visit: Payer: Self-pay | Admitting: Psychiatry

## 2017-02-02 ENCOUNTER — Ambulatory Visit (INDEPENDENT_AMBULATORY_CARE_PROVIDER_SITE_OTHER): Payer: Medicare Other | Admitting: Psychiatry

## 2017-02-02 DIAGNOSIS — F0283 Dementia in other diseases classified elsewhere, unspecified severity, with mood disturbance: Secondary | ICD-10-CM

## 2017-02-02 DIAGNOSIS — D472 Monoclonal gammopathy: Secondary | ICD-10-CM

## 2017-02-02 DIAGNOSIS — F329 Major depressive disorder, single episode, unspecified: Secondary | ICD-10-CM

## 2017-02-02 DIAGNOSIS — F0280 Dementia in other diseases classified elsewhere without behavioral disturbance: Secondary | ICD-10-CM

## 2017-02-02 DIAGNOSIS — G309 Alzheimer's disease, unspecified: Secondary | ICD-10-CM | POA: Diagnosis not present

## 2017-02-02 MED ORDER — BUSPIRONE HCL 10 MG PO TABS: 10.0000 mg | ORAL_TABLET | Freq: Every day | ORAL | 1 refills | Status: DC

## 2017-02-02 MED ORDER — LAMOTRIGINE 25 MG PO TABS: 25.0000 mg | ORAL_TABLET | Freq: Every day | ORAL | 1 refills | Status: DC

## 2017-02-02 MED ORDER — CITALOPRAM HYDROBROMIDE 10 MG PO TABS: 10.0000 mg | ORAL_TABLET | Freq: Every day | ORAL | 1 refills | Status: DC

## 2017-02-02 MED ORDER — QUETIAPINE FUMARATE 400 MG PO TABS: 400.0000 mg | ORAL_TABLET | Freq: Every day | ORAL | 1 refills | Status: AC

## 2017-02-02 MED ORDER — DONEPEZIL HCL 10 MG PO TABS: 10.0000 mg | ORAL_TABLET | Freq: Every day | ORAL | 3 refills | Status: DC

## 2017-02-02 NOTE — Progress Notes (Signed)
Psychiatric MD Progress Note  Patient Identification: Kurt Miller MRN:  [id number] Date of Evaluation:  02/02/2017 Referral Source: [facility] Neurology  Chief Complaint:    Visit Diagnosis:    ICD-10-CM   1. 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 He has history of dementia.Patient remains confused and was unable to answer any questions as he has long and short-term memory problems. I asked him several questions but his memory remains impaired. He was able to tell me that he ate outside at the restaurant but unable to provide me the names.he remains pleasant and confused. His wife was helping him. His wife reported that he was also recently diagnosed with hearing loss and they are going to follow up for his hearing aids. She reported that he has started reading books and has been obsessively reading books sitting for 6 hours at a time. He has some obsessive component to his thoughts and has been circling words on the books and has been talking to his wife about the same. Patient continues to have obsessive thoughts about going outside with his wife but then he has to return home. He continues to have the same feelings since we has started seeing him. His wife reported that the current combination of medication has been helping him and he is responding well. She does not want to have his medications adjusted at this time.     He reported that he is here with his wife, however he feels that his mind is empty and does not know the reason for this interview. Most of the history was provided by his wife.    Associated  Signs/Symptoms: Depression Symptoms:  anhedonia, psychomotor retardation, fatigue, impaired memory, anxiety, (Hypo) Manic Symptoms:  Labiality of Mood, Anxiety Symptoms:  Excessive Worry, Psychotic Symptoms:  none PTSD Symptoms: Negative NA  Past Psychiatric History: Bipolar Disorder  Previous Psychotropic Medications: Lithium Geodon Namenda zoloft  Substance Abuse History in the last 12 months:  No.  Consequences of Substance Abuse: Negative NA  Past Medical History:  Past Medical History:  Diagnosis Date  . Anemia   . Anxiety   . Cancer ([place])    Kidney  . Chronic kidney disease   . Depression   . Hypertension     Past Surgical History:  Procedure Laterality Date  . KIDNEY SURGERY    . NOSE SURGERY    . TONSILLECTOMY      Family Psychiatric History:  Brother- ADHD Son- Bipolar Son- Depression  Family History:  Family History  Problem Relation Age of Onset  . Cancer Mother   . Diabetes Mother   . Heart attack Father   . Hypertension Brother   . Stroke Brother   . ADD / ADHD Brother   . Hypertension Brother     Social History:   Social History   Social History  . Marital status: Married    Spouse name: N/A  . Number of children: N/A  . Years of education: N/A   Social History Main Topics  . Smoking status: Former Smoker    Packs/day: 0.25    Years: 10.00  Types: Cigarettes    Quit date: 05/12/1984  . Smokeless tobacco: Never Used  . Alcohol use No  . Drug use: No  . Sexual activity: Not Currently   Other Topics Concern  . Not on file   Social History Narrative  . No narrative on file    Additional Social History:  Married x  4 times. Currently for 26 years. Has 4 children.   Allergies:   Allergies  Allergen Reactions  . Acetaminophen Other (See Comments)    Pancreatitis  . Famotidine Other (See Comments)    Pancreatitis    Metabolic Disorder Labs: No results found for: HGBA1C, MPG No results found for:  PROLACTIN Lab Results  Component Value Date   CHOL 139 04/14/2014   TRIG 41 04/14/2014   HDL 58 04/14/2014   VLDL 8 04/14/2014   LDLCALC 73 04/14/2014     Current Medications: Current Outpatient Prescriptions  Medication Sig Dispense Refill  . amLODipine (NORVASC) 10 MG tablet Take 10 mg by mouth daily.     Marland Kitchen aspirin 81 MG tablet Take by mouth.    . busPIRone (BUSPAR) 10 MG tablet Take 1 tablet (10 mg total) by mouth daily at 2 PM. 90 tablet 1  . Cholecalciferol (VITAMIN D3) 2000 units TABS Take 1 tablet by mouth daily.    . Cinnamon 500 MG capsule Take by mouth.    . citalopram (CELEXA) 10 MG tablet Take 1 tablet (10 mg total) by mouth daily. 90 tablet 1  . donepezil (ARICEPT) 10 MG tablet Take 1 tablet (10 mg total) by mouth at bedtime. 90 tablet 3  . finasteride (PROSCAR) 5 MG tablet TAKE 1 TABLET DAILY    . fluticasone (FLONASE) 50 MCG/ACT nasal spray     . Glycerin, Laxative, (RA GLYCERIN ADULT) 80.7 % SUPP Reported on 10/10/2015    . lamoTRIgine (LAMICTAL) 25 MG tablet Take 1 tablet (25 mg total) by mouth daily. 90 tablet 1  . lisinopril (PRINIVIL,ZESTRIL) 40 MG tablet TAKE 1 TABLET EVERY MORNING FOR BLOOD PRESSURE    . Magnesium Oxide 500 MG (LAX) TABS Take 1 tablet by mouth daily.    . Melatonin 5 MG CAPS Take 1 capsule by mouth at bedtime as needed.    . Mesalamine (ASACOL HD) 800 MG TBEC TAKE 2 TABLETS TWICE A DAY    . Multiple Vitamins-Minerals (SENIOR MULTIVITAMIN PLUS PO) Take by mouth.    . Omega-3 Fatty Acids (FISH OIL) 1000 MG CAPS Take by mouth.    Marland Kitchen omeprazole (PRILOSEC) 40 MG capsule TAKE 1 CAPSULE DAILY    . pravastatin (PRAVACHOL) 20 MG tablet TAKE 1 TABLET DAILY    . QUEtiapine (SEROQUEL) 400 MG tablet Take 1 tablet (400 mg total) by mouth at bedtime. 90 tablet 1  . sodium bicarbonate 650 MG tablet 650 mg 2 (two) times daily.    . tamsulosin (FLOMAX) 0.4 MG CAPS capsule TAKE 1 CAPSULE DAILY    . vitamin E (VITAMIN E) 1000 UNIT capsule Take 1,000 Units by mouth  daily.     No current facility-administered medications for this visit.     Neurologic: Headache: Yes Seizure: No Paresthesias:No  Musculoskeletal: Strength & Muscle Tone: within normal limits Gait & Station: normal Patient leans: N/A  Psychiatric Specialty Exam: ROS  There were no vitals taken for this visit.There is no height or weight on file to calculate BMI.  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 I will continue  the dose of Seroquel 400 mg at bedtime   BuSpar 10 mg at 8 am.  Continue on Celexa and Aricept as prescribed  lamotrigine 25 mg daily. Discussed with him about side effects of the medication including the risk of rash and he demonstrated understanding.   He will follow-up in 2 month or earlier depending on his symptoms   More than 50% of the time spent in psychoeducation, counseling and coordination of care.    This note was generated in part or whole with voice recognition software. Voice regonition is usually quite accurate but there are transcription errors that can and very often do occur. I apologize for any typographical errors that were not  detected and corrected.    Rainey Pines, MD 10/22/20182:09 PM

## 2017-03-30 ENCOUNTER — Other Ambulatory Visit: Payer: Self-pay

## 2017-03-30 ENCOUNTER — Encounter: Payer: Self-pay | Admitting: Psychiatry

## 2017-03-30 ENCOUNTER — Ambulatory Visit (INDEPENDENT_AMBULATORY_CARE_PROVIDER_SITE_OTHER): Payer: Medicare Other | Admitting: Psychiatry

## 2017-03-30 VITALS — BP 154/73 | HR 82 | Temp 98.2°F | Wt 230.0 lb

## 2017-03-30 DIAGNOSIS — F329 Major depressive disorder, single episode, unspecified: Secondary | ICD-10-CM

## 2017-03-30 DIAGNOSIS — D472 Monoclonal gammopathy: Secondary | ICD-10-CM | POA: Diagnosis not present

## 2017-03-30 DIAGNOSIS — F0280 Dementia in other diseases classified elsewhere without behavioral disturbance: Secondary | ICD-10-CM | POA: Diagnosis not present

## 2017-03-30 DIAGNOSIS — G309 Alzheimer's disease, unspecified: Secondary | ICD-10-CM

## 2017-03-30 DIAGNOSIS — F0283 Dementia in other diseases classified elsewhere, unspecified severity, with mood disturbance: Secondary | ICD-10-CM

## 2017-03-30 MED ORDER — CITALOPRAM HYDROBROMIDE 10 MG PO TABS: 10.0000 mg | ORAL_TABLET | Freq: Every day | ORAL | 1 refills | Status: DC

## 2017-03-30 MED ORDER — LAMOTRIGINE 25 MG PO TABS: 25.0000 mg | ORAL_TABLET | Freq: Every day | ORAL | 1 refills | Status: DC

## 2017-03-30 MED ORDER — BUSPIRONE HCL 10 MG PO TABS: 10.0000 mg | ORAL_TABLET | Freq: Every day | ORAL | 1 refills | Status: DC

## 2017-03-30 NOTE — Progress Notes (Signed)
Psychiatric MD Progress Note  Patient Identification: Kurt Miller MRN:  [id number] Date of Evaluation:  03/30/2017 Referral Source: [facility]  Chief Complaint:   Chief Complaint    Follow-up; Medication Refill     Visit Diagnosis:    ICD-10-CM   1. 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 .Patient remains calm and was able to answer questions appropriately. He reported that he is ready for the holidays and is going out with his wife. His wife reported that he is stable on his current medications. She reported that he is more alert and is compliant with his medications. She reported that he is playing word puzzles and games on a daily basis and she has noticed that his memory is getting better. He does not have any side effects of the medications. She reported that he sleeps well at night. She has been helping him with the medications. They have good relationship with each other. They were given for the grandchildren and looking forward for the holidays. Patient does not have any side effects of the medications at this time.  He denied having any perceptual disturbances. He denied having any suicidal homicidal ideations or plans.   .    Associated Signs/Symptoms: Depression Symptoms:  anhedonia, psychomotor retardation, fatigue, impaired memory, anxiety, (Hypo) Manic Symptoms:  Labiality of Mood, Anxiety Symptoms:  Excessive Worry, Psychotic Symptoms:  none PTSD Symptoms: Negative NA  Past Psychiatric History: Bipolar Disorder  Previous Psychotropic Medications: Lithium Geodon Namenda zoloft  Substance Abuse History in the last 12 months:   No.  Consequences of Substance Abuse: Negative NA  Past Medical History:  Past Medical History:  Diagnosis Date  . Anemia   . Anxiety   . Cancer ([place])    Kidney  . Chronic kidney disease   . Depression   . Hypertension     Past Surgical History:  Procedure Laterality Date  . KIDNEY SURGERY    . NOSE SURGERY    . TONSILLECTOMY      Family Psychiatric History:  Brother- ADHD Son- Bipolar Son- Depression  Family History:  Family History  Problem Relation Age of Onset  . Cancer Mother   . Diabetes Mother   . Heart attack Father   . Hypertension Brother   . Stroke Brother   . ADD / ADHD Brother   . Hypertension Brother     Social History:   Social History   Socioeconomic History  . Marital status: Married    Spouse name: None  . Number of children: None  . Years of education: None  . Highest education level: None  Social Needs  . Financial resource strain: None  . Food insecurity - worry: None  . Food insecurity - inability: None  . Transportation needs - medical: None  . Transportation needs - non-medical: None  Occupational History  . None  Tobacco Use  . Smoking status: Former Smoker    Packs/day: 0.25    Years: 10.00    Pack years: 2.50    Types: Cigarettes    Last attempt to quit: 05/12/1984    Years since quitting: 32.9  . Smokeless tobacco: Never Used  Substance and Sexual Activity  .  Alcohol use: No  . Drug use: No  . Sexual activity: Not Currently  Other Topics Concern  . None  Social History Narrative  . None    Additional Social History:  Married x  4 times. Currently for 26 years. Has 4 children.   Allergies:   Allergies  Allergen Reactions  . Acetaminophen Other (See Comments)    Pancreatitis  . Famotidine Other (See Comments)    Pancreatitis    Metabolic Disorder Labs: No results found for: HGBA1C, MPG No results found for: PROLACTIN Lab Results  Component Value Date   CHOL 139 04/14/2014   TRIG 41 04/14/2014    HDL 58 04/14/2014   VLDL 8 04/14/2014   LDLCALC 73 04/14/2014     Current Medications: Current Outpatient Medications  Medication Sig Dispense Refill  . amLODipine (NORVASC) 10 MG tablet Take 10 mg by mouth daily.     Marland Kitchen aspirin 81 MG tablet Take by mouth.    . busPIRone (BUSPAR) 10 MG tablet Take 1 tablet (10 mg total) by mouth daily at 2 PM. 90 tablet 1  . Cholecalciferol (VITAMIN D3) 2000 units TABS Take 1 tablet by mouth daily.    . Cinnamon 500 MG capsule Take by mouth.    . citalopram (CELEXA) 10 MG tablet Take 1 tablet (10 mg total) by mouth daily. 90 tablet 1  . donepezil (ARICEPT) 10 MG tablet Take 1 tablet (10 mg total) by mouth at bedtime. 90 tablet 3  . finasteride (PROSCAR) 5 MG tablet TAKE 1 TABLET DAILY    . fluticasone (FLONASE) 50 MCG/ACT nasal spray     . Glycerin, Laxative, (RA GLYCERIN ADULT) 80.7 % SUPP Reported on 10/10/2015    . lamoTRIgine (LAMICTAL) 25 MG tablet Take 1 tablet (25 mg total) by mouth daily. 90 tablet 1  . lisinopril (PRINIVIL,ZESTRIL) 40 MG tablet TAKE 1 TABLET EVERY MORNING FOR BLOOD PRESSURE    . Magnesium Oxide 500 MG (LAX) TABS Take 1 tablet by mouth daily.    . Melatonin 5 MG CAPS Take 1 capsule by mouth at bedtime as needed.    . Mesalamine (ASACOL HD) 800 MG TBEC TAKE 2 TABLETS TWICE A DAY    . Multiple Vitamins-Minerals (SENIOR MULTIVITAMIN PLUS PO) Take by mouth.    . Omega-3 Fatty Acids (FISH OIL) 1000 MG CAPS Take by mouth.    Marland Kitchen omeprazole (PRILOSEC) 40 MG capsule TAKE 1 CAPSULE DAILY    . pravastatin (PRAVACHOL) 20 MG tablet TAKE 1 TABLET DAILY    . QUEtiapine (SEROQUEL) 400 MG tablet Take 1 tablet (400 mg total) by mouth at bedtime. 90 tablet 1  . sodium bicarbonate 650 MG tablet 650 mg 2 (two) times daily.    . tamsulosin (FLOMAX) 0.4 MG CAPS capsule TAKE 1 CAPSULE DAILY    . vitamin E (VITAMIN E) 1000 UNIT capsule Take 1,000 Units by mouth daily.     No current facility-administered medications for this visit.      Neurologic: Headache: Yes Seizure: No Paresthesias:No  Musculoskeletal: Strength & Muscle Tone: within normal limits Gait & Station: normal Patient leans: N/A  Psychiatric Specialty Exam: ROS  Blood pressure (!) 154/73, pulse 82, temperature 98.2 F (36.8 C), temperature source Oral, weight 230 lb (104.3 kg).Body mass index is 33.48 kg/m.  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 I will continue  the dose of Seroquel 400 mg at bedtime   BuSpar 10 mg at 8 am.  Continue on Celexa and Aricept as prescribed  lamotrigine 25 mg daily. Discussed with him about side effects of the medication including the risk of rash and he demonstrated understanding.   He will follow-up in 3 month or earlier depending on his symptoms   More than 50% of the time spent in psychoeducation, counseling and coordination of care.    This note was generated in part or whole with voice recognition software. Voice regonition is usually quite accurate but there are transcription errors that can and very often do occur. I apologize for any typographical errors that were not detected and corrected.     Rainey Pines, MD 12/17/201812:57 PM

## 2017-06-22 ENCOUNTER — Ambulatory Visit: Payer: Medicare Other | Admitting: Psychiatry

## 2017-10-22 ENCOUNTER — Other Ambulatory Visit: Payer: Medicare Other

## 2017-10-23 ENCOUNTER — Inpatient Hospital Stay: Payer: Medicare Other

## 2017-10-26 ENCOUNTER — Inpatient Hospital Stay: Payer: Medicare Other | Attending: Oncology

## 2017-10-26 DIAGNOSIS — Z87891 Personal history of nicotine dependence: Secondary | ICD-10-CM | POA: Insufficient documentation

## 2017-10-26 DIAGNOSIS — I10 Essential (primary) hypertension: Secondary | ICD-10-CM | POA: Insufficient documentation

## 2017-10-26 DIAGNOSIS — D509 Iron deficiency anemia, unspecified: Secondary | ICD-10-CM | POA: Insufficient documentation

## 2017-10-26 DIAGNOSIS — D472 Monoclonal gammopathy: Secondary | ICD-10-CM | POA: Insufficient documentation

## 2017-10-26 LAB — CBC WITH DIFFERENTIAL/PLATELET
BASOS ABS: 0 10*3/uL (ref 0–0.1)
BASOS PCT: 1 %
EOS ABS: 0 10*3/uL (ref 0–0.7)
Eosinophils Relative: 0 %
HCT: 40.2 % (ref 40.0–52.0)
HEMOGLOBIN: 13.7 g/dL (ref 13.0–18.0)
Lymphocytes Relative: 33 %
Lymphs Abs: 1.5 10*3/uL (ref 1.0–3.6)
MCH: 30.9 pg (ref 26.0–34.0)
MCHC: 34.2 g/dL (ref 32.0–36.0)
MCV: 90.3 fL (ref 80.0–100.0)
MONOS PCT: 11 %
Monocytes Absolute: 0.5 10*3/uL (ref 0.2–1.0)
Neutro Abs: 2.5 10*3/uL (ref 1.4–6.5)
Neutrophils Relative %: 55 %
Platelets: 266 10*3/uL (ref 150–440)
RBC: 4.45 MIL/uL (ref 4.40–5.90)
RDW: 14.1 % (ref 11.5–14.5)
WBC: 4.5 10*3/uL (ref 3.8–10.6)

## 2017-10-26 LAB — BASIC METABOLIC PANEL
Anion gap: 10 (ref 5–15)
BUN: 24 mg/dL — ABNORMAL HIGH (ref 8–23)
CALCIUM: 9.2 mg/dL (ref 8.9–10.3)
CO2: 25 mmol/L (ref 22–32)
CREATININE: 1.11 mg/dL (ref 0.61–1.24)
Chloride: 107 mmol/L (ref 98–111)
Glucose, Bld: 101 mg/dL — ABNORMAL HIGH (ref 70–99)
Potassium: 3.6 mmol/L (ref 3.5–5.1)
SODIUM: 142 mmol/L (ref 135–145)

## 2017-10-27 ENCOUNTER — Ambulatory Visit: Payer: Medicare Other | Admitting: Oncology

## 2017-10-27 LAB — IGG, IGA, IGM
IgA: 174 mg/dL (ref 61–437)
IgG (Immunoglobin G), Serum: 975 mg/dL (ref 700–1600)
IgM (Immunoglobulin M), Srm: 38 mg/dL (ref 15–143)

## 2017-10-27 LAB — KAPPA/LAMBDA LIGHT CHAINS
KAPPA FREE LGHT CHN: 22.3 mg/L — AB (ref 3.3–19.4)
Kappa, lambda light chain ratio: 2.06 — ABNORMAL HIGH (ref 0.26–1.65)
LAMDA FREE LIGHT CHAINS: 10.8 mg/L (ref 5.7–26.3)

## 2017-10-28 LAB — PROTEIN ELECTROPHORESIS, SERUM
A/G RATIO SPE: 1.3 (ref 0.7–1.7)
Albumin ELP: 4 g/dL (ref 2.9–4.4)
Alpha-1-Globulin: 0.2 g/dL (ref 0.0–0.4)
Alpha-2-Globulin: 0.9 g/dL (ref 0.4–1.0)
Beta Globulin: 1.1 g/dL (ref 0.7–1.3)
GLOBULIN, TOTAL: 3.1 g/dL (ref 2.2–3.9)
Gamma Globulin: 0.9 g/dL (ref 0.4–1.8)
M-Spike, %: 0.3 g/dL — ABNORMAL HIGH
Total Protein ELP: 7.1 g/dL (ref 6.0–8.5)

## 2017-10-29 ENCOUNTER — Ambulatory Visit: Payer: Medicare Other | Admitting: Oncology

## 2017-11-02 ENCOUNTER — Other Ambulatory Visit: Payer: Self-pay

## 2017-11-02 ENCOUNTER — Encounter: Payer: Self-pay | Admitting: Oncology

## 2017-11-02 ENCOUNTER — Inpatient Hospital Stay (HOSPITAL_BASED_OUTPATIENT_CLINIC_OR_DEPARTMENT_OTHER): Payer: Medicare Other | Admitting: Oncology

## 2017-11-02 VITALS — BP 151/74 | HR 72 | Temp 97.8°F | Resp 16 | Ht 71.0 in | Wt 232.0 lb

## 2017-11-02 DIAGNOSIS — I10 Essential (primary) hypertension: Secondary | ICD-10-CM | POA: Diagnosis not present

## 2017-11-02 DIAGNOSIS — D472 Monoclonal gammopathy: Secondary | ICD-10-CM | POA: Diagnosis not present

## 2017-11-02 DIAGNOSIS — D509 Iron deficiency anemia, unspecified: Secondary | ICD-10-CM

## 2017-11-02 DIAGNOSIS — Z87891 Personal history of nicotine dependence: Secondary | ICD-10-CM | POA: Diagnosis not present

## 2017-11-02 DIAGNOSIS — C9110 Chronic lymphocytic leukemia of B-cell type not having achieved remission: Secondary | ICD-10-CM

## 2017-11-02 NOTE — Progress Notes (Signed)
Patient here for follow up. Some dry scaly skin on the top of his head.

## 2017-11-02 NOTE — Progress Notes (Signed)
[facility]  [phone] [phone]  ID: ALOYS HUPFER OB: 12/11/1940  MR#: [id number]  BPZ#:[id number]  Patient Care Team: Madelyn Brunner, MD as PCP - General (Internal Medicine)  CHIEF COMPLAINT: Iron deficiency anemia, MGUS  INTERVAL HISTORY: Patient returns to clinic today for repeat laboratory work and routine yearly evaluation.  He currently feels well and is asymptomatic.  He does not complain of weakness or fatigue. He has no neurologic complaints. He denies any recent fevers or illnesses. He has a good appetite and denies weight loss. He denies any pain. He has no chest pain or shortness of breath. He denies any nausea, vomiting, constipation, or diarrhea. He has no urinary complaints.  Patient feels at his baseline offers no specific complaints today.  REVIEW OF SYSTEMS:   Review of Systems  Constitutional: Negative.  Negative for fever, malaise/fatigue and weight loss.  Respiratory: Negative.  Negative for cough and shortness of breath.   Cardiovascular: Negative.  Negative for chest pain and leg swelling.  Gastrointestinal: Negative.  Negative for abdominal pain.  Genitourinary: Negative.  Negative for dysuria.  Musculoskeletal: Negative.  Negative for back pain.  Skin: Negative.  Negative for rash.  Neurological: Negative.  Negative for sensory change, focal weakness, weakness and headaches.  Psychiatric/Behavioral: Positive for memory loss. The patient is not nervous/anxious.     As per HPI. Otherwise, a complete review of systems is negative.  PAST MEDICAL HISTORY: Past Medical History:  Diagnosis Date  . Anemia   . Anxiety   . Cancer ([place])    Kidney  . Chronic kidney disease   . Depression   . Hypertension     PAST SURGICAL HISTORY: Past Surgical History:  Procedure Laterality Date  . KIDNEY SURGERY    . NOSE SURGERY    . TONSILLECTOMY      FAMILY HISTORY: Family History  Problem Relation Age of Onset  .  Cancer Mother   . Diabetes Mother   . Heart attack Father   . Hypertension Brother   . Stroke Brother   . ADD / ADHD Brother   . Hypertension Brother     ADVANCED DIRECTIVES (Y/N):  N  HEALTH MAINTENANCE: Social History   Tobacco Use  . Smoking status: Former Smoker    Packs/day: 0.25    Years: 10.00    Pack years: 2.50    Types: Cigarettes    Last attempt to quit: 05/12/1984    Years since quitting: 33.5  . Smokeless tobacco: Never Used  Substance Use Topics  . Alcohol use: No  . Drug use: No     Colonoscopy:  PAP:  Bone density:  Lipid panel:  Allergies  Allergen Reactions  . Acetaminophen Other (See Comments)    Pancreatitis  . Famotidine Other (See Comments)    Pancreatitis    Current Outpatient Medications  Medication Sig Dispense Refill  . amLODipine (NORVASC) 10 MG tablet Take 10 mg by mouth daily.     Marland Kitchen aspirin 81 MG tablet Take by mouth.    . busPIRone (BUSPAR) 10 MG tablet Take 1 tablet (10 mg total) by mouth daily at 2 PM. 90 tablet 1  . Cholecalciferol (VITAMIN D3) 2000 units TABS Take 1 tablet by mouth daily.    . Cinnamon 500 MG capsule Take by mouth.    . citalopram (CELEXA) 10 MG tablet Take 1 tablet (10 mg total) by mouth daily. 90 tablet 1  . donepezil (ARICEPT) 10 MG tablet Take 1  tablet (10 mg total) by mouth at bedtime. 90 tablet 3  . finasteride (PROSCAR) 5 MG tablet TAKE 1 TABLET DAILY    . hydrochlorothiazide (HYDRODIURIL) 12.5 MG tablet Take 1 tablet by mouth daily.    Marland Kitchen lamoTRIgine (LAMICTAL) 25 MG tablet Take 1 tablet (25 mg total) by mouth daily. 90 tablet 1  . lisinopril (PRINIVIL,ZESTRIL) 40 MG tablet TAKE 1 TABLET EVERY MORNING FOR BLOOD PRESSURE    . Magnesium Oxide 500 MG (LAX) TABS Take 1 tablet by mouth daily.    . Melatonin 5 MG CAPS Take 1 capsule by mouth at bedtime as needed.    . Mesalamine (ASACOL HD) 800 MG TBEC TAKE 2 TABLETS TWICE A DAY    . Multiple Vitamins-Minerals (SENIOR MULTIVITAMIN PLUS PO) Take by mouth.    .  Omega-3 Fatty Acids (FISH OIL) 1000 MG CAPS Take by mouth.    Marland Kitchen omeprazole (PRILOSEC) 40 MG capsule TAKE 1 CAPSULE DAILY    . pravastatin (PRAVACHOL) 20 MG tablet TAKE 1 TABLET DAILY    . QUEtiapine (SEROQUEL) 400 MG tablet Take 1 tablet (400 mg total) by mouth at bedtime. 90 tablet 1  . sodium bicarbonate 650 MG tablet 650 mg 2 (two) times daily.    . tamsulosin (FLOMAX) 0.4 MG CAPS capsule TAKE 1 CAPSULE DAILY    . vitamin E (VITAMIN E) 1000 UNIT capsule Take 1,000 Units by mouth daily.    Marland Kitchen loratadine (CLARITIN) 10 MG tablet Take 1 tablet by mouth as needed.     No current facility-administered medications for this visit.     OBJECTIVE: Vitals:   11/02/17 1337 11/02/17 1348  BP:  (!) 151/74  Pulse:  72  Resp: 16   Temp:  97.8 F (36.6 C)     Body mass index is 32.36 kg/m.    ECOG FS:0 - Asymptomatic  General: Well-developed, well-nourished, no acute distress. Eyes: Pink conjunctiva, anicteric sclera. HEENT: Normocephalic, moist mucous membranes, clear oropharnyx. Lungs: Clear to auscultation bilaterally. Heart: Regular rate and rhythm. No rubs, murmurs, or gallops. Abdomen: Soft, nontender, nondistended. No organomegaly noted, normoactive bowel sounds. Musculoskeletal: No edema, cyanosis, or clubbing. Neuro: Alert, answering all questions appropriately. Cranial nerves grossly intact. Skin: No rashes or petechiae noted. Psych: Normal affect.   LAB RESULTS:  Lab Results  Component Value Date   NA 142 10/26/2017   K 3.6 10/26/2017   CL 107 10/26/2017   CO2 25 10/26/2017   GLUCOSE 101 (H) 10/26/2017   BUN 24 (H) 10/26/2017   CREATININE 1.11 10/26/2017   CALCIUM 9.2 10/26/2017   PROT 7.2 04/13/2014   ALBUMIN 3.5 04/13/2014   AST 34 04/13/2014   ALT 44 04/13/2014   ALKPHOS 146 (H) 04/13/2014   BILITOT 0.5 04/13/2014   GFRNONAA >60 10/26/2017   GFRAA >60 10/26/2017    Lab Results  Component Value Date   WBC 4.5 10/26/2017   NEUTROABS 2.5 10/26/2017   HGB 13.7  10/26/2017   HCT 40.2 10/26/2017   MCV 90.3 10/26/2017   PLT 266 10/26/2017   Lab Results  Component Value Date   IRON 107 10/09/2016   TIBC 396 10/09/2016   IRONPCTSAT 27 10/09/2016   Lab Results  Component Value Date   FERRITIN 20 (L) 10/09/2016   Lab Results  Component Value Date   TOTALPROTELP 7.1 10/26/2017   ALBUMINELP 4.0 10/26/2017   A1GS 0.2 10/26/2017   A2GS 0.9 10/26/2017   BETS 1.1 10/26/2017   GAMS 0.9 10/26/2017   MSPIKE  0.3 (H) 10/26/2017   SPEI Comment 10/26/2017     STUDIES: No results found.  ASSESSMENT: Iron deficiency anemia, MGUS  PLAN:    1. Iron deficiency anemia: Patient's hemoglobin continues to be within normal limits.  No intervention is needed at this time.  2. MGUS: Patient's M spike continues to be stable at 0.3.  He has a mildly elevated kappa free light chain, but immunoglobulins are all within normal limits.  He is at low risk for progressing to multiple myeloma.  He does not require bone marrow biopsy.  Return to clinic in 1 year for further evaluation.   3. 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 This was only seen and 9% of leukocytes. In the setting of a normal white blood cell count and patient asymptomatic, no further intervention is needed.   Patient expressed understanding and was in agreement with this plan. He also understands that He can call clinic at any time with any questions, concerns, or complaints.    Lloyd Huger, MD   11/03/2017 3:42 PM

## 2018-01-20 ENCOUNTER — Other Ambulatory Visit: Payer: Self-pay

## 2018-01-20 ENCOUNTER — Encounter: Payer: Self-pay | Admitting: Emergency Medicine

## 2018-01-20 ENCOUNTER — Observation Stay
Admission: EM | Admit: 2018-01-20 | Discharge: 2018-01-21 | Disposition: A | Discharge status: Home / Self Care | Payer: Medicare Other | Attending: Internal Medicine | Admitting: Internal Medicine

## 2018-01-20 ENCOUNTER — Emergency Department: Payer: Medicare Other

## 2018-01-20 DIAGNOSIS — Z7982 Long term (current) use of aspirin: Secondary | ICD-10-CM | POA: Insufficient documentation

## 2018-01-20 DIAGNOSIS — Z87891 Personal history of nicotine dependence: Secondary | ICD-10-CM | POA: Diagnosis not present

## 2018-01-20 DIAGNOSIS — F0280 Dementia in other diseases classified elsewhere without behavioral disturbance: Secondary | ICD-10-CM | POA: Insufficient documentation

## 2018-01-20 DIAGNOSIS — E785 Hyperlipidemia, unspecified: Secondary | ICD-10-CM | POA: Insufficient documentation

## 2018-01-20 DIAGNOSIS — Z79899 Other long term (current) drug therapy: Secondary | ICD-10-CM | POA: Insufficient documentation

## 2018-01-20 DIAGNOSIS — N179 Acute kidney failure, unspecified: Secondary | ICD-10-CM | POA: Insufficient documentation

## 2018-01-20 DIAGNOSIS — F319 Bipolar disorder, unspecified: Secondary | ICD-10-CM | POA: Diagnosis not present

## 2018-01-20 DIAGNOSIS — R410 Disorientation, unspecified: Secondary | ICD-10-CM

## 2018-01-20 DIAGNOSIS — I4891 Unspecified atrial fibrillation: Secondary | ICD-10-CM | POA: Insufficient documentation

## 2018-01-20 DIAGNOSIS — G309 Alzheimer's disease, unspecified: Secondary | ICD-10-CM | POA: Insufficient documentation

## 2018-01-20 DIAGNOSIS — N183 Chronic kidney disease, stage 3 (moderate): Secondary | ICD-10-CM | POA: Insufficient documentation

## 2018-01-20 DIAGNOSIS — J189 Pneumonia, unspecified organism: Principal | ICD-10-CM | POA: Insufficient documentation

## 2018-01-20 DIAGNOSIS — Z85528 Personal history of other malignant neoplasm of kidney: Secondary | ICD-10-CM | POA: Insufficient documentation

## 2018-01-20 DIAGNOSIS — R000 Tachycardia, unspecified: Secondary | ICD-10-CM | POA: Insufficient documentation

## 2018-01-20 DIAGNOSIS — F419 Anxiety disorder, unspecified: Secondary | ICD-10-CM | POA: Diagnosis not present

## 2018-01-20 DIAGNOSIS — A419 Sepsis, unspecified organism: Secondary | ICD-10-CM | POA: Diagnosis present

## 2018-01-20 DIAGNOSIS — Z8249 Family history of ischemic heart disease and other diseases of the circulatory system: Secondary | ICD-10-CM | POA: Diagnosis not present

## 2018-01-20 DIAGNOSIS — Z23 Encounter for immunization: Secondary | ICD-10-CM | POA: Diagnosis not present

## 2018-01-20 DIAGNOSIS — I129 Hypertensive chronic kidney disease with stage 1 through stage 4 chronic kidney disease, or unspecified chronic kidney disease: Secondary | ICD-10-CM | POA: Diagnosis not present

## 2018-01-20 DIAGNOSIS — C9110 Chronic lymphocytic leukemia of B-cell type not having achieved remission: Secondary | ICD-10-CM | POA: Insufficient documentation

## 2018-01-20 DIAGNOSIS — Z886 Allergy status to analgesic agent status: Secondary | ICD-10-CM | POA: Diagnosis not present

## 2018-01-20 DIAGNOSIS — Z888 Allergy status to other drugs, medicaments and biological substances status: Secondary | ICD-10-CM | POA: Diagnosis not present

## 2018-01-20 LAB — CBC WITH DIFFERENTIAL/PLATELET
Abs Immature Granulocytes: 0.12 10*3/uL — ABNORMAL HIGH (ref 0.00–0.07)
BASOS PCT: 0 %
Basophils Absolute: 0.1 10*3/uL (ref 0.0–0.1)
Eosinophils Absolute: 0.2 10*3/uL (ref 0.0–0.5)
Eosinophils Relative: 1 %
HCT: 40.6 % (ref 39.0–52.0)
Hemoglobin: 14.1 g/dL (ref 13.0–17.0)
Immature Granulocytes: 1 %
Lymphocytes Relative: 5 %
Lymphs Abs: 1 10*3/uL (ref 0.7–4.0)
MCH: 31 pg (ref 26.0–34.0)
MCHC: 34.7 g/dL (ref 30.0–36.0)
MCV: 89.2 fL (ref 80.0–100.0)
Monocytes Absolute: 1.5 10*3/uL — ABNORMAL HIGH (ref 0.1–1.0)
Monocytes Relative: 7 %
NEUTROS ABS: 18.9 10*3/uL — AB (ref 1.7–7.7)
NEUTROS PCT: 86 %
PLATELETS: 288 10*3/uL (ref 150–400)
RBC: 4.55 MIL/uL (ref 4.22–5.81)
RDW: 13.7 % (ref 11.5–15.5)
Smear Review: NORMAL
WBC: 21.7 10*3/uL — ABNORMAL HIGH (ref 4.0–10.5)
nRBC: 0 % (ref 0.0–0.2)

## 2018-01-20 LAB — COMPREHENSIVE METABOLIC PANEL
ALBUMIN: 4.1 g/dL (ref 3.5–5.0)
ALK PHOS: 71 U/L (ref 38–126)
ALT: 31 U/L (ref 0–44)
ANION GAP: 13 (ref 5–15)
AST: 37 U/L (ref 15–41)
BILIRUBIN TOTAL: 1.5 mg/dL — AB (ref 0.3–1.2)
BUN: 20 mg/dL (ref 8–23)
CALCIUM: 9.3 mg/dL (ref 8.9–10.3)
CO2: 25 mmol/L (ref 22–32)
Chloride: 101 mmol/L (ref 98–111)
Creatinine, Ser: 1.55 mg/dL — ABNORMAL HIGH (ref 0.61–1.24)
GFR calc Af Amer: 48 mL/min — ABNORMAL LOW (ref >60)
GFR calc non Af Amer: 41 mL/min — ABNORMAL LOW (ref >60)
GLUCOSE: 135 mg/dL — AB (ref 70–99)
Potassium: 4.1 mmol/L (ref 3.5–5.1)
SODIUM: 139 mmol/L (ref 135–145)
TOTAL PROTEIN: 7.4 g/dL (ref 6.5–8.1)

## 2018-01-20 LAB — URINALYSIS, COMPLETE (UACMP) WITH MICROSCOPIC
BILIRUBIN URINE: NEGATIVE
Bacteria, UA: NONE SEEN
Glucose, UA: NEGATIVE mg/dL
HGB URINE DIPSTICK: NEGATIVE
Ketones, ur: NEGATIVE mg/dL
Leukocytes, UA: NEGATIVE
Nitrite: NEGATIVE
PH: 7 (ref 5.0–8.0)
Protein, ur: NEGATIVE mg/dL
Specific Gravity, Urine: 1.01 (ref 1.005–1.030)
Squamous Epithelial / LPF: NONE SEEN (ref 0–5)

## 2018-01-20 LAB — LACTIC ACID, PLASMA
Lactic Acid, Venous: 3 mmol/L (ref 0.5–1.9)
Lactic Acid, Venous: 3.1 mmol/L (ref 0.5–1.9)

## 2018-01-20 MED ORDER — QUETIAPINE FUMARATE 200 MG PO TABS
400.0000 mg | ORAL_TABLET | Freq: Every day | ORAL | Status: DC
  Administered 2018-01-20: 20:00:00 400 mg via ORAL
  Filled 2018-01-20 (×2): qty 2

## 2018-01-20 MED ORDER — FINASTERIDE 5 MG PO TABS
5.0000 mg | ORAL_TABLET | Freq: Every day | ORAL | Status: DC
  Administered 2018-01-20 – 2018-01-21 (×2): 5 mg via ORAL
  Filled 2018-01-20 (×2): qty 1

## 2018-01-20 MED ORDER — SODIUM CHLORIDE 0.9 % IV SOLN
500.0000 mg | INTRAVENOUS | Status: DC
  Administered 2018-01-20: 500 mg via INTRAVENOUS
  Filled 2018-01-20: qty 500

## 2018-01-20 MED ORDER — LACTATED RINGERS IV BOLUS (SEPSIS)
1000.0000 mL | Freq: Once | INTRAVENOUS | Status: AC
  Administered 2018-01-20: 19:00:00 1000 mL via INTRAVENOUS

## 2018-01-20 MED ORDER — ASPIRIN EC 81 MG PO TBEC
81.0000 mg | DELAYED_RELEASE_TABLET | Freq: Every day | ORAL | Status: DC
  Administered 2018-01-20 – 2018-01-21 (×2): 81 mg via ORAL
  Filled 2018-01-20 (×2): qty 1

## 2018-01-20 MED ORDER — VITAMIN E 45 MG (100 UNIT) PO CAPS
1000.0000 [IU] | ORAL_CAPSULE | Freq: Every day | ORAL | Status: DC
  Administered 2018-01-20 – 2018-01-21 (×2): 1000 [IU] via ORAL
  Filled 2018-01-20 (×2): qty 2

## 2018-01-20 MED ORDER — SODIUM CHLORIDE 0.9 % IV SOLN
500.0000 mg | INTRAVENOUS | Status: DC
  Administered 2018-01-21: 16:00:00 500 mg via INTRAVENOUS
  Filled 2018-01-20: qty 500

## 2018-01-20 MED ORDER — SODIUM CHLORIDE 0.9 % IV SOLN
2.0000 g | INTRAVENOUS | Status: DC
  Administered 2018-01-20: 2 g via INTRAVENOUS
  Filled 2018-01-20: qty 20

## 2018-01-20 MED ORDER — PANTOPRAZOLE SODIUM 40 MG PO TBEC
40.0000 mg | DELAYED_RELEASE_TABLET | Freq: Every day | ORAL | Status: DC
  Administered 2018-01-20 – 2018-01-21 (×2): 40 mg via ORAL
  Filled 2018-01-20 (×2): qty 1

## 2018-01-20 MED ORDER — LORATADINE 10 MG PO TABS: 10.0000 mg | ORAL_TABLET | ORAL | Status: DC | PRN

## 2018-01-20 MED ORDER — ADULT MULTIVITAMIN W/MINERALS CH
1.0000 | ORAL_TABLET | Freq: Every day | ORAL | Status: DC
  Administered 2018-01-20 – 2018-01-21 (×2): 1 via ORAL
  Filled 2018-01-20 (×2): qty 1

## 2018-01-20 MED ORDER — SODIUM CHLORIDE 0.9 % IV SOLN
INTRAVENOUS | Status: AC
  Administered 2018-01-20 – 2018-01-21 (×2): via INTRAVENOUS

## 2018-01-20 MED ORDER — VITAMIN D3 25 MCG (1000 UNIT) PO TABS
2000.0000 [IU] | ORAL_TABLET | Freq: Every day | ORAL | Status: DC
  Administered 2018-01-20 – 2018-01-21 (×2): 2000 [IU] via ORAL
  Filled 2018-01-20 (×3): qty 2

## 2018-01-20 MED ORDER — MAGNESIUM OXIDE 400 (241.3 MG) MG PO TABS
400.0000 mg | ORAL_TABLET | Freq: Every day | ORAL | Status: DC
  Administered 2018-01-20 – 2018-01-21 (×2): 400 mg via ORAL
  Filled 2018-01-20 (×2): qty 1

## 2018-01-20 MED ORDER — AMLODIPINE BESYLATE 10 MG PO TABS
10.0000 mg | ORAL_TABLET | Freq: Every day | ORAL | Status: DC
  Administered 2018-01-20 – 2018-01-21 (×2): 10 mg via ORAL
  Filled 2018-01-20 (×2): qty 1

## 2018-01-20 MED ORDER — LACTATED RINGERS IV BOLUS (SEPSIS)
1000.0000 mL | Freq: Once | INTRAVENOUS | Status: AC
  Administered 2018-01-20: 18:00:00 1000 mL via INTRAVENOUS

## 2018-01-20 MED ORDER — INFLUENZA VAC SPLIT HIGH-DOSE 0.5 ML IM SUSY
0.5000 mL | PREFILLED_SYRINGE | INTRAMUSCULAR | Status: AC
  Administered 2018-01-21: 11:00:00 0.5 mL via INTRAMUSCULAR
  Filled 2018-01-20: qty 0.5

## 2018-01-20 MED ORDER — DONEPEZIL HCL 5 MG PO TABS
10.0000 mg | ORAL_TABLET | Freq: Every day | ORAL | Status: DC
  Administered 2018-01-20: 20:00:00 10 mg via ORAL
  Filled 2018-01-20 (×2): qty 2

## 2018-01-20 MED ORDER — ENOXAPARIN SODIUM 40 MG/0.4ML ~~LOC~~ SOLN
40.0000 mg | SUBCUTANEOUS | Status: DC
  Administered 2018-01-20: 20:00:00 40 mg via SUBCUTANEOUS
  Filled 2018-01-20: qty 0.4

## 2018-01-20 MED ORDER — PNEUMOCOCCAL VAC POLYVALENT 25 MCG/0.5ML IJ INJ
0.5000 mL | INJECTION | INTRAMUSCULAR | Status: AC
  Administered 2018-01-21: 11:00:00 0.5 mL via INTRAMUSCULAR
  Filled 2018-01-20: qty 0.5

## 2018-01-20 MED ORDER — CITALOPRAM HYDROBROMIDE 20 MG PO TABS
10.0000 mg | ORAL_TABLET | Freq: Every day | ORAL | Status: DC
Start: 2018-01-20 — End: 2018-01-21
  Administered 2018-01-20 – 2018-01-21 (×2): 10 mg via ORAL
  Filled 2018-01-20 (×2): qty 1

## 2018-01-20 MED ORDER — LACTATED RINGERS IV BOLUS (SEPSIS)
1000.0000 mL | Freq: Once | INTRAVENOUS | Status: AC
  Administered 2018-01-20: 1000 mL via INTRAVENOUS

## 2018-01-20 MED ORDER — BUSPIRONE HCL 10 MG PO TABS
10.0000 mg | ORAL_TABLET | Freq: Every day | ORAL | Status: DC
  Administered 2018-01-20 – 2018-01-21 (×2): 10 mg via ORAL
  Filled 2018-01-20 (×2): qty 1

## 2018-01-20 MED ORDER — LACTATED RINGERS IV BOLUS (SEPSIS): 500.0000 mL | Freq: Once | INTRAVENOUS | Status: DC

## 2018-01-20 MED ORDER — PRAVASTATIN SODIUM 20 MG PO TABS
20.0000 mg | ORAL_TABLET | Freq: Every day | ORAL | Status: DC
  Administered 2018-01-20 – 2018-01-21 (×2): 20 mg via ORAL
  Filled 2018-01-20 (×2): qty 1

## 2018-01-20 MED ORDER — SODIUM CHLORIDE 0.9 % IV SOLN
1.0000 g | INTRAVENOUS | Status: DC
  Filled 2018-01-20: qty 10

## 2018-01-20 MED ORDER — TAMSULOSIN HCL 0.4 MG PO CAPS
0.4000 mg | ORAL_CAPSULE | Freq: Every day | ORAL | Status: DC
  Administered 2018-01-20 – 2018-01-21 (×2): 0.4 mg via ORAL
  Filled 2018-01-20 (×2): qty 1

## 2018-01-20 MED ORDER — MELATONIN 5 MG PO TABS
1.0000 | ORAL_TABLET | Freq: Every evening | ORAL | Status: DC | PRN
  Administered 2018-01-20: 5 mg via ORAL
  Filled 2018-01-20 (×3): qty 1

## 2018-01-20 MED ORDER — SODIUM BICARBONATE 650 MG PO TABS
650.0000 mg | ORAL_TABLET | Freq: Every day | ORAL | Status: DC
  Administered 2018-01-20 – 2018-01-21 (×2): 650 mg via ORAL
  Filled 2018-01-20 (×3): qty 1

## 2018-01-20 MED ORDER — MESALAMINE 1.2 G PO TBEC
1.2000 g | DELAYED_RELEASE_TABLET | Freq: Two times a day (BID) | ORAL | Status: DC
  Administered 2018-01-20 – 2018-01-21 (×2): 1.2 g via ORAL
  Filled 2018-01-20 (×3): qty 1

## 2018-01-20 NOTE — Plan of Care (Signed)
  Problem: Education: Goal: Knowledge of General Education information will improve Description Including pain rating scale, medication(s)/side effects and non-pharmacologic comfort measures Outcome: Progressing Note:  Confusion alert pleasant   Problem: Health Behavior/Discharge Planning: Goal: Ability to manage health-related needs will improve Outcome: Progressing   Problem: Clinical Measurements: Goal: Ability to maintain clinical measurements within normal limits will improve Outcome: Progressing Goal: Will remain free from infection Outcome: Progressing Goal: Diagnostic test results will improve Outcome: Progressing Goal: Respiratory complications will improve Outcome: Progressing Goal: Cardiovascular complication will be avoided Outcome: Progressing   Problem: Activity: Goal: Risk for activity intolerance will decrease Outcome: Progressing   Problem: Nutrition: Goal: Adequate nutrition will be maintained Outcome: Progressing   Problem: Coping: Goal: Level of anxiety will decrease Outcome: Progressing   Problem: Elimination: Goal: Will not experience complications related to bowel motility Outcome: Progressing Goal: Will not experience complications related to urinary retention Outcome: Progressing   Problem: Pain Managment: Goal: General experience of comfort will improve Outcome: Progressing   Problem: Safety: Goal: Ability to remain free from injury will improve Outcome: Progressing   Problem: Skin Integrity: Goal: Risk for impaired skin integrity will decrease Outcome: Progressing

## 2018-01-20 NOTE — ED Provider Notes (Addendum)
[facility] Emergency Department Provider Note  ____________________________________________   I have reviewed the triage vital signs and the nursing notes.   HISTORY  Chief Complaint Fever and Altered Mental Status   History limited by and level 5 caveat due to:    HPI Kurt Miller is a 77 y.o. male who presents to the emergency department today accompanied by wife because of concerns for confusion and possible aspiration event.  The wife states that last night while he was sleeping he started coughing.  She noticed he was breathing a little bit heavier.  She did have some chills and he had a fever this morning.  This morning the patient also had some confusion.  She thinks that his mentation has improved throughout the day.  He however still not quite as sharp as she normally expects him to be.  He does not have any pre-existing lung disease.   Per medical record review patient has a history of ckd, htn  Past Medical History:  Diagnosis Date  . Anemia   . Anxiety   . Cancer ([place])    Kidney  . Chronic kidney disease   . Depression   . Hypertension     Patient Active Problem List   Diagnosis Date Noted  . 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 Abdominal pain 10/11/2014  . Acute inflammation of the pancreas 10/11/2014  . Blood in feces 10/11/2014  . Calculus of gallbladder 10/11/2014  . Cervical pain 10/11/2014  . Narrowing of intervertebral disc space 10/11/2014  . Disorder of function of stomach 10/11/2014  . Accumulation of fluid in tissues 10/11/2014  . Anxiety, generalized 10/11/2014  . History of digestive disease 10/11/2014  . LBP (low back pain) 10/11/2014  . Amnesia 10/11/2014  . Headache, migraine 10/11/2014  . Excessive urination at night 10/11/2014  . Arthralgia of hip or thigh 10/11/2014  . Apnea, sleep 10/11/2014  . CN (constipation) 10/11/2014  . Obstructive apnea 08/24/2014  . 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 Clinical depression 02/21/2014  . Chronic ulcerative colitis ([place]) 01/05/2014  . Iron deficiency anemia 07/04/2013  . Bipolar 1 disorder ([place]) 07/04/2013  . Cancer of kidney ([place]) 07/04/2013  . 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 H/O urinary disorder 01/10/2013  . Impaired renal function 01/10/2013  . Urgency of micturation 05/10/2012  . Hypercholesteremia 08/18/2011  . Hernia, incisional 02/13/2011  . Affective bipolar disorder ([place]) 11/08/2010  . Cough 11/08/2010  . Dry mouth 11/08/2010  . Benign essential HTN 11/08/2010  . PND (post-nasal drip) 11/08/2010    Past Surgical History:  Procedure Laterality Date  . KIDNEY SURGERY    . NOSE SURGERY    . TONSILLECTOMY      Prior to Admission medications   Medication Sig Start Date End Date Taking? Authorizing Provider  amLODipine (NORVASC) 10 MG tablet Take 10 mg by mouth daily.  02/22/14   [provider]  aspirin 81 MG tablet Take by mouth. 05/29/10   [provider]  busPIRone (BUSPAR) 10 MG tablet Take 1 tablet (10 mg total) by mouth daily at 2 PM. 03/30/17   Rainey Pines, MD  Cholecalciferol (VITAMIN D3) 2000 units TABS Take 1 tablet by mouth daily.    [provider]  Cinnamon 500 MG capsule Take by mouth.    [provider]  citalopram (CELEXA) 10 MG tablet Take 1 tablet (10 mg total) by mouth daily. 03/30/17  Rainey Pines, MD  donepezil (ARICEPT) 10 MG tablet Take 1 tablet (10 mg total) by mouth at bedtime. 02/02/17   Rainey Pines, MD  finasteride (PROSCAR) 5 MG tablet TAKE 1 TABLET DAILY 08/21/14   [provider]  hydrochlorothiazide (HYDRODIURIL) 12.5 MG tablet Take 1 tablet by mouth daily. 05/23/15   [provider]  lamoTRIgine (LAMICTAL) 25 MG tablet Take 1 tablet (25 mg total) by mouth daily. 03/30/17   Rainey Pines, MD  lisinopril (PRINIVIL,ZESTRIL) 40 MG tablet TAKE 1 TABLET EVERY MORNING  FOR BLOOD PRESSURE 01/01/14   [provider]  loratadine (CLARITIN) 10 MG tablet Take 1 tablet by mouth as needed.    [provider]  Magnesium Oxide 500 MG (LAX) TABS Take 1 tablet by mouth daily.    [provider]  Melatonin 5 MG CAPS Take 1 capsule by mouth at bedtime as needed.    [provider]  Mesalamine (ASACOL HD) 800 MG TBEC TAKE 2 TABLETS TWICE A DAY 06/09/14   [provider]  Multiple Vitamins-Minerals (SENIOR MULTIVITAMIN PLUS PO) Take by mouth.    [provider]  Omega-3 Fatty Acids (FISH OIL) 1000 MG CAPS Take by mouth. 09/28/08   [provider]  omeprazole (PRILOSEC) 40 MG capsule TAKE 1 CAPSULE DAILY 07/31/14   [provider]  pravastatin (PRAVACHOL) 20 MG tablet TAKE 1 TABLET DAILY 08/01/14   [provider]  QUEtiapine (SEROQUEL) 400 MG tablet Take 1 tablet (400 mg total) by mouth at bedtime. 02/02/17   Rainey Pines, MD  sodium bicarbonate 650 MG tablet 650 mg 2 (two) times daily.    [provider]  tamsulosin (FLOMAX) 0.4 MG CAPS capsule TAKE 1 CAPSULE DAILY 09/05/14   [provider]  vitamin E (VITAMIN E) 1000 UNIT capsule Take 1,000 Units by mouth daily.    [provider]    Allergies Acetaminophen and Famotidine  Family History  Problem Relation Age of Onset  . Cancer Mother   . Diabetes Mother   . Heart attack Father   . Hypertension Brother   . Stroke Brother   . ADD / ADHD Brother   . Hypertension Brother     Social History Social History   Tobacco Use  . Smoking status: Former Smoker    Packs/day: 0.25    Years: 10.00    Pack years: 2.50    Types: Cigarettes    Last attempt to quit: 05/12/1984    Years since quitting: 33.7  . Smokeless tobacco: Never Used  Substance Use Topics  . Alcohol use: No  . Drug use: No    Review of Systems Unable to obtain reliable ROS secondary to  ams ____________________________________________   PHYSICAL EXAM:  VITAL SIGNS: ED Triage Vitals  Enc Vitals Group     BP 01/20/18 1440 124/68     Pulse Rate 01/20/18 1440 (!) 123     Resp 01/20/18 1440 18     Temp 01/20/18 1440 98.9 F (37.2 C)     Temp Source 01/20/18 1440 Oral     SpO2 01/20/18 1440 96 %     Weight 01/20/18 1442 230 lb (104.3 kg)     Height 01/20/18 1442 5\' 9"  (1.753 m)     Head Circumference --      Peak Flow --      Pain Score 01/20/18 1441 0   Constitutional: Awake and alert. Not completely alert to events.  Eyes: Conjunctivae are normal.  ENT  Head: Normocephalic and atraumatic.      Nose: No congestion/rhinnorhea.      Mouth/Throat: Mucous membranes are moist.      Neck: No stridor. Hematological/Lymphatic/Immunilogical: No cervical lymphadenopathy. Cardiovascular: Tachycardic, regular rhythm.  No murmurs, rubs, or gallops.  Respiratory: Normal respiratory effort without tachypnea nor retractions. Breath sounds are clear and equal bilaterally. No wheezes/rales/rhonchi. Gastrointestinal: Soft and non tender. No rebound. No guarding.  Genitourinary: Deferred Musculoskeletal: Normal range of motion in all extremities. No lower extremity edema. Neurologic:  Slight confusion. No gross focal neurologic deficits are appreciated.  Skin:  Skin is warm, dry and intact. No rash noted. Psychiatric: Mood and affect are normal. Speech and behavior are normal. Patient exhibits appropriate insight and judgment.  ____________________________________________    LABS (pertinent positives/negatives)  Lactic 3.0 CMP na 139, k 4.1, glu 135, cr 1.55, t bili 1.5 CBC wbc 21.7, hgb 14.1, plt 288 ____________________________________________   EKG  I, Nance Pear, attending physician, personally viewed and interpreted this EKG  EKG Time: 1457 Rate: 124 Rhythm: sinus tachycardia Axis: normal Intervals: qtc 479 QRS: narrow ST changes: no st  elevation Impression: abnormal ekg   ____________________________________________    RADIOLOGY  CXR Pneumonia  ____________________________________________   PROCEDURES  Procedures  CRITICAL CARE Performed by: Nance Pear   Total critical care time: 20 minutes  Critical care time was exclusive of separately billable procedures and treating other patients.  Critical care was necessary to treat or prevent imminent or life-threatening deterioration.  Critical care was time spent personally by me on the following activities: development of treatment plan with patient and/or surrogate as well as nursing, discussions with consultants, evaluation of patient's response to treatment, examination of patient, obtaining history from patient or surrogate, ordering and performing treatments and interventions, ordering and review of laboratory studies, ordering and review of radiographic studies, pulse oximetry and re-evaluation of patient's condition.  ____________________________________________   INITIAL IMPRESSION / ASSESSMENT AND PLAN / ED COURSE  Pertinent labs & imaging results that were available during my care of the patient were reviewed by me and considered in my medical decision making (see chart for details).   Patient presented to the emergency department today because of concerns for confusion possible aspiration event.  On exam patient is awake but not completely oriented.  Patient was noted to be quite tachycardic.  Elevated white blood cell and elevated lactic acid level and blood work concerning for sepsis.  Chest x-ray is concerning for pneumonia.  Discussed this finding with patient and wife.  Will plan on admission, IV antibiotics.  ____________________________________________   FINAL CLINICAL IMPRESSION(S) / ED DIAGNOSES  Final diagnoses:  Pneumonia due to infectious organism, unspecified laterality, unspecified part of lung  Sepsis, due to unspecified  organism, unspecified whether acute organ dysfunction present [facility])  Confusion     Note: This dictation was prepared with Dragon dictation. Any transcriptional errors that result from this process are unintentional     Nance Pear, MD 01/20/18 [place], [place], MD 02/11/18 [phone]

## 2018-01-20 NOTE — ED Triage Notes (Signed)
First Nurse Note:  Arrives from Neurology for ED evaluation of possible aspiration pneumonia.  Wife reports patient was "choking" last night while sleeping.  States patient has been more "lethargic and intermittently confused today.  Just not himself".    Patient is Awake and alert.  NAD.

## 2018-01-20 NOTE — ED Notes (Signed)
Attempted to call report. Bed changed to 110

## 2018-01-20 NOTE — ED Triage Notes (Signed)
Pt presents to ED c/o chills and rigors today around 0400. 100.6 temp at 0630. Given tylenol this morning. Wife states pt seemed confused to her this morning but has improved over time today. Wife also states pt woke up with a coughing fit last night but does not c/o cough at this time. Hx kidney cancer but has not had treatment since 2005. Pt denies urinary symtoms at this time.

## 2018-01-20 NOTE — H&P (Signed)
[place] [facility] [place] NAME: Kurt Miller    MR#:  [id number]  DATE OF BIRTH:  Aug 18, 1940  DATE OF ADMISSION:  01/20/2018  PRIMARY CARE PHYSICIAN: Madelyn Brunner, MD   REQUESTING/REFERRING PHYSICIAN: Dr. Archie Balboa  CHIEF COMPLAINT:   Fever and cough HISTORY OF PRESENT ILLNESS:  Kurt Miller  is a 77 y.o. male with a known history of hypertension, depression, anxiety, dementia has been having fever associated with cough since yesterday.  Patient went to the primary care physician Dr. Edwina Barth and he was tachycardic and hypotensive at that point and patient was sent over to ED.  Chest x-ray has revealed left-sided pneumonia.  White blood cell count is elevated 21.7 and lactic acid is elevated 3.0.  Sepsis protocol implemented and hospitalist team is called to admit the patient.  Patient was given IV Rocephin and azithromycin fluids.  Wife at bedside.  Patient is demented and poor historian  PAST MEDICAL HISTORY:   Past Medical History:  Diagnosis Date  . Anemia   . Anxiety   . Cancer ([place])    Kidney  . Chronic kidney disease   . Depression   . Hypertension     PAST SURGICAL HISTOIRY:   Past Surgical History:  Procedure Laterality Date  . KIDNEY SURGERY    . NOSE SURGERY    . TONSILLECTOMY      SOCIAL HISTORY:   Social History   Tobacco Use  . Smoking status: Former Smoker    Packs/day: 0.25    Years: 10.00    Pack years: 2.50    Types: Cigarettes    Last attempt to quit: 05/12/1984    Years since quitting: 33.7  . Smokeless tobacco: Never Used  Substance Use Topics  . Alcohol use: No    FAMILY HISTORY:   Family History  Problem Relation Age of Onset  . Cancer Mother   . Diabetes Mother   . Heart attack Father   . Hypertension Brother   . Stroke Brother   . ADD / ADHD Brother   . Hypertension Brother     DRUG ALLERGIES:   Allergies  Allergen Reactions  . Acetaminophen Other (See Comments)   Pancreatitis  . Famotidine Other (See Comments)    Pancreatitis    REVIEW OF SYSTEMS:  Unobtainable in view of dementia MEDICATIONS AT HOME:   Prior to Admission medications   Medication Sig Start Date End Date Taking? Authorizing Provider  amLODipine (NORVASC) 10 MG tablet Take 10 mg by mouth daily.  02/22/14   [provider]  aspirin 81 MG tablet Take by mouth. 05/29/10   [provider]  busPIRone (BUSPAR) 10 MG tablet Take 1 tablet (10 mg total) by mouth daily at 2 PM. 03/30/17   Rainey Pines, MD  Cholecalciferol (VITAMIN D3) 2000 units TABS Take 1 tablet by mouth daily.    [provider]  Cinnamon 500 MG capsule Take by mouth.    [provider]  citalopram (CELEXA) 10 MG tablet Take 1 tablet (10 mg total) by mouth daily. 03/30/17   Rainey Pines, MD  donepezil (ARICEPT) 10 MG tablet Take 1 tablet (10 mg total) by mouth at bedtime. 02/02/17   Rainey Pines, MD  finasteride (PROSCAR) 5 MG tablet TAKE 1 TABLET DAILY 08/21/14   [provider]  hydrochlorothiazide (HYDRODIURIL) 12.5 MG tablet Take 1 tablet by mouth daily. 05/23/15   [provider]  lamoTRIgine (LAMICTAL) 25 MG tablet Take  1 tablet (25 mg total) by mouth daily. 03/30/17   Rainey Pines, MD  lisinopril (PRINIVIL,ZESTRIL) 40 MG tablet TAKE 1 TABLET EVERY MORNING FOR BLOOD PRESSURE 01/01/14   [provider]  loratadine (CLARITIN) 10 MG tablet Take 1 tablet by mouth as needed.    [provider]  Magnesium Oxide 500 MG (LAX) TABS Take 1 tablet by mouth daily.    [provider]  Melatonin 5 MG CAPS Take 1 capsule by mouth at bedtime as needed.    [provider]  Mesalamine (ASACOL HD) 800 MG TBEC TAKE 2 TABLETS TWICE A DAY 06/09/14   [provider]  Multiple Vitamins-Minerals (SENIOR MULTIVITAMIN PLUS PO) Take by mouth.    [provider]  Omega-3 Fatty Acids (FISH OIL) 1000 MG CAPS Take by mouth. 09/28/08   [provider]  omeprazole (PRILOSEC) 40 MG capsule TAKE 1 CAPSULE DAILY 07/31/14   [provider]  pravastatin (PRAVACHOL) 20 MG tablet TAKE 1 TABLET DAILY 08/01/14   [provider]  QUEtiapine (SEROQUEL) 400 MG tablet Take 1 tablet (400 mg total) by mouth at bedtime. 02/02/17   Rainey Pines, MD  sodium bicarbonate 650 MG tablet 650 mg 2 (two) times daily.    [provider]  tamsulosin (FLOMAX) 0.4 MG CAPS capsule TAKE 1 CAPSULE DAILY 09/05/14   [provider]  vitamin E (VITAMIN E) 1000 UNIT capsule Take 1,000 Units by mouth daily.    [provider]      VITAL SIGNS:  Blood pressure 124/68, pulse (!) 123, temperature 98.9 F (37.2 C), temperature source Oral, resp. rate 18, height _0  (1.753 m), weight 104.3 kg, SpO2 96 %.  PHYSICAL EXAMINATION:  GENERAL:  77 y.o.-year-old patient lying in the bed with no acute distress.  EYES: Pupils equal, round, reactive to light and accommodation. No scleral icterus. Extraocular muscles intact.  HEENT: Head atraumatic, normocephalic. Oropharynx and nasopharynx clear.  NECK:  Supple, no jugular venous distention. No thyroid enlargement, no tenderness.  LUNGS: Moderate breath sounds bilaterally, no wheezing, rales,rhonchi , left-sided crepitations. No use of accessory muscles of respiration.  CARDIOVASCULAR: S1, S2 normal. No murmurs, rubs, or gallops.  ABDOMEN: Soft, nontender, nondistended. Bowel sounds present.  EXTREMITIES: No pedal edema, cyanosis, or clubbing.  NEUROLOGIC: Cranial nerves II through XII are grossly intact sensation intact. Gait not checked.  PSYCHIATRIC: The patient is alert and oriented x 2.  SKIN: No obvious rash, lesion, or ulcer.   LABORATORY PANEL:   CBC Recent Labs  Lab 01/20/18 1445  WBC 21.7*  HGB 14.1  HCT 40.6  PLT 288   ------------------------------------------------------------------------------------------------------------------  Chemistries  Recent Labs   Lab 01/20/18 1445  NA 139  K 4.1  CL 101  CO2 25  GLUCOSE 135*  BUN 20  CREATININE 1.55*  CALCIUM 9.3  AST 37  ALT 31  ALKPHOS 71  BILITOT 1.5*   ------------------------------------------------------------------------------------------------------------------  Cardiac Enzymes No results for input(s): TROPONINI in the last 168 hours. ------------------------------------------------------------------------------------------------------------------  RADIOLOGY:  Dg Chest 2 View  Result Date: 01/20/2018 CLINICAL DATA:  Assess for possible aspiration pneumonia EXAM: CHEST - 2 VIEW COMPARISON:  April 13, 2014 FINDINGS: The heart size and mediastinal contours are within normal limits. Pneumonia is identified in the left mid lung and left lung base. The right lung is clear. There is no pleural effusion or pulmonary edema. The visualized skeletal structures are unremarkable. IMPRESSION: Left mid lung and left lung base pneumonia. Electronically Signed   By:  Abelardo Diesel M.D.   On: 01/20/2018 15:34    EKG:   Orders placed or performed during the hospital encounter of 01/20/18  . ED EKG  . ED EKG    IMPRESSION AND PLAN:    #Sepsis secondary to pneumonia Admit to MedSurg floor Patient met septic criteria with leukocytosis and elevated lactic acid Blood cultures and sputum cultures Check urine for Legionella antigen and streptococcal antibody IV antibiotics Rocephin and azithromycin IV fluids Monitor lactic acid level which is elevated  #Acute kidney injury on chronic kidney disease Baseline creatinine 1.11 in July 2019 Hydrate with IV fluids Avoid nephrotoxins Monitor renal function closely  #Dementia resume his home medications Donepezil  #Hyperlipidemia -resume home medication statin  #Essential hypertension Resume amlodipine    All the records are reviewed and case discussed with ED provider. Management plans discussed with the patient, family and they are  in agreement.  CODE STATUS: fc   TOTAL TIME TAKING CARE OF THIS PATIENT: 43  minutes.   Note: This dictation was prepared with Dragon dictation along with smaller phrase technology. Any transcriptional errors that result from this process are unintentional.  Nicholes Mango M.D on 01/20/2018 at 4:31 PM  Between 7am to 6pm - Pager - [phone]  After 6pm go to [email or web address] - password [email or web address] [facility]  [facility]  Office  [phone]  CC: Primary care physician; Madelyn Brunner, MD

## 2018-01-20 NOTE — ED Notes (Signed)
3 attempts at IV--GH.

## 2018-01-20 NOTE — Progress Notes (Signed)
CODE SEPSIS - PHARMACY COMMUNICATION  **Broad Spectrum Antibiotics should be administered within 1 hour of Sepsis diagnosis**  Time Code Sepsis Called/Page Received: 1546  Antibiotics Ordered: azithromycin and ceftriaxone  Time of 1st antibiotic administration: 1627  Additional action taken by pharmacy: none required  If necessary, Name of Provider/Nurse Contacted: N/A    Dallie Piles ,PharmD Clinical Pharmacist  01/20/2018  5:01 PM

## 2018-01-20 NOTE — Progress Notes (Signed)
Family Meeting Note  Advance Directive:yes  Today a meeting took place with the Patient, wife at bed side  Patient is unable to participate due BW:GYKZLD capacity dementia   The following clinical team members were present during this meeting:MD  The following were discussed:Patient's diagnosis: Sepsis, left-sided community-acquired pneumonia, acute kidney injury, other comorbidities as documented below, treatment plan of care discussed in detail with the patient and his wife at bedside.  They both verbalized understanding of the plan.    Anemia    . Anxiety   . Cancer ([place])    Kidney  . Chronic kidney disease   . Depression   . Hypertension        Patient's progosis: Unable to determine and Goals for treatment: Full Code, wife Xhaiden Coombs is a healthcare power of attorney  Additional follow-up to be provided: Hospitalist  Time spent during discussion:17 min  Nicholes Mango, MD

## 2018-01-20 NOTE — ED Notes (Signed)
1 set of blood cultures drawn along with blood work and sent to lab.

## 2018-01-21 DIAGNOSIS — J189 Pneumonia, unspecified organism: Secondary | ICD-10-CM | POA: Diagnosis not present

## 2018-01-21 LAB — BASIC METABOLIC PANEL
Anion gap: 8 (ref 5–15)
BUN: 17 mg/dL (ref 8–23)
CHLORIDE: 107 mmol/L (ref 98–111)
CO2: 26 mmol/L (ref 22–32)
Calcium: 8.4 mg/dL — ABNORMAL LOW (ref 8.9–10.3)
Creatinine, Ser: 1.1 mg/dL (ref 0.61–1.24)
GFR calc Af Amer: >60 mL/min (ref >60)
GFR calc non Af Amer: >60 mL/min (ref >60)
GLUCOSE: 93 mg/dL (ref 70–99)
POTASSIUM: 3.3 mmol/L — AB (ref 3.5–5.1)
Sodium: 141 mmol/L (ref 135–145)

## 2018-01-21 LAB — CBC
HEMATOCRIT: 33.1 % — AB (ref 39.0–52.0)
HEMOGLOBIN: 11.3 g/dL — AB (ref 13.0–17.0)
MCH: 30.7 pg (ref 26.0–34.0)
MCHC: 34.1 g/dL (ref 30.0–36.0)
MCV: 89.9 fL (ref 80.0–100.0)
PLATELETS: 227 10*3/uL (ref 150–400)
RBC: 3.68 MIL/uL — ABNORMAL LOW (ref 4.22–5.81)
RDW: 14.1 % (ref 11.5–15.5)
WBC: 14.8 10*3/uL — ABNORMAL HIGH (ref 4.0–10.5)
nRBC: 0 % (ref 0.0–0.2)

## 2018-01-21 LAB — LACTIC ACID, PLASMA: Lactic Acid, Venous: 1.4 mmol/L (ref 0.5–1.9)

## 2018-01-21 LAB — STREP PNEUMONIAE URINARY ANTIGEN: Strep Pneumo Urinary Antigen: NEGATIVE

## 2018-01-21 MED ORDER — CEFUROXIME AXETIL 500 MG PO TABS: 500.0000 mg | ORAL_TABLET | Freq: Two times a day (BID) | ORAL | 0 refills | Status: DC

## 2018-01-21 MED ORDER — POTASSIUM CHLORIDE CRYS ER 20 MEQ PO TBCR
40.0000 meq | EXTENDED_RELEASE_TABLET | Freq: Once | ORAL | Status: AC
  Administered 2018-01-21: 10:00:00 40 meq via ORAL
  Filled 2018-01-21: qty 2

## 2018-01-21 MED ORDER — SODIUM CHLORIDE 0.9 % IV SOLN
1.0000 g | INTRAVENOUS | Status: DC
  Administered 2018-01-21: 1 g via INTRAVENOUS
  Filled 2018-01-21: qty 10

## 2018-01-21 MED ORDER — AZITHROMYCIN 500 MG PO TABS: 500.0000 mg | ORAL_TABLET | Freq: Every day | ORAL | 0 refills | Status: AC

## 2018-01-21 NOTE — Progress Notes (Addendum)
Gave pnt rocephin early, as requested by MD Mody. Pnt is ready for discharge, paperwork gone over with both pnt and wife and scripts given. Education completed. Pnt discharged with nurse tech via wheelchair with wife who will be taking pnt home.

## 2018-01-21 NOTE — Discharge Summary (Signed)
[facility] NAME: Kurt Miller    MR#:  [id number]  DATE OF BIRTH:  [birth year]  DATE OF ADMISSION:  01/20/2018 ADMITTING PHYSICIAN: Nicholes Mango, MD  DATE OF DISCHARGE: 01/21/2018   PRIMARY CARE PHYSICIAN: Baxter Hire, MD    ADMISSION DIAGNOSIS:  Confusion [R41.0] Pneumonia due to infectious organism, unspecified laterality, unspecified part of lung [J18.9] Sepsis, due to unspecified organism, unspecified whether acute organ dysfunction present ([place]) [A41.9]  DISCHARGE DIAGNOSIS:  Active Problems:   Sepsis ([place]) pna  SECONDARY DIAGNOSIS:   Past Medical History:  Diagnosis Date  . Anemia   . Anxiety   . Cancer ([place])    Kidney  . Chronic kidney disease   . Depression   . Hypertension     HOSPITAL COURSE:    77 year old male with a history of chronic kidney disease stage II and Alzheimer's dementia who presented to the emergency room with fever and cough.  1.  Sepsis: Patient presented with leukocytosis, tachycardia and tachypnea.  Etiology of sepsis is due to community-acquired pneumonia.  Chest x-ray shows left lower lung/middle lobe pneumonia.  Sepsis has resolved  2.  Community-acquired pneumonia:  He was on Rocephin and azithromycin IV while in hospital.  Patient symptoms have much improved.  Patient was discharged on oral Ceftin and azithromycin.  3.  Acute kidney injury on chronic kidney disease stage III due to sepsis which has resolved with IV fluids.  4.  Dementia: Patient continue donepezil   5.  Essential hypertension: Continue Norvasc  6.  Short-lived atrial fibrillation due to underlying pneumonia    DISCHARGE CONDITIONS AND DIET:   Stable for discharge on regular diet  CONSULTS OBTAINED:    DRUG ALLERGIES:   Allergies  Allergen Reactions  . Acetaminophen Other (See Comments)    Pancreatitis  . Famotidine Other (See Comments)    Pancreatitis    DISCHARGE MEDICATIONS:    Allergies as of 01/21/2018      Reactions   Acetaminophen Other (See Comments)   Pancreatitis   Famotidine Other (See Comments)   Pancreatitis      Medication List    TAKE these medications   amLODipine 10 MG tablet Commonly known as:  NORVASC Take 10 mg by mouth daily.   ASACOL HD 800 MG Tbec Generic drug:  Mesalamine TAKE 2 TABLETS TWICE A DAY   aspirin 81 MG tablet Take by mouth.   azithromycin 500 MG tablet Commonly known as:  ZITHROMAX Take 1 tablet (500 mg total) by mouth daily for 3 days. Take 1 tablet daily for 3 days.   busPIRone 10 MG tablet Commonly known as:  BUSPAR Take 1 tablet (10 mg total) by mouth daily at 2 PM.   cefUROXime 500 MG tablet Commonly known as:  CEFTIN Take 1 tablet (500 mg total) by mouth 2 (two) times daily with a meal.   Cinnamon 500 MG capsule Take by mouth.   citalopram 10 MG tablet Commonly known as:  CELEXA Take 1 tablet (10 mg total) by mouth daily.   donepezil 10 MG tablet Commonly known as:  ARICEPT Take 1 tablet (10 mg total) by mouth at bedtime.   finasteride 5 MG tablet Commonly known as:  PROSCAR TAKE 1 TABLET DAILY   Fish Oil 1000 MG Caps Take by mouth.   hydrochlorothiazide 12.5 MG tablet Commonly known as:  HYDRODIURIL Take 1 tablet by mouth daily.   lamoTRIgine 25 MG tablet Commonly known as:  LAMICTAL Take 1 tablet (25 mg total) by mouth daily.   lisinopril 40 MG tablet Commonly known as:  PRINIVIL,ZESTRIL TAKE 1 TABLET EVERY MORNING FOR BLOOD PRESSURE   loratadine 10 MG tablet Commonly known as:  CLARITIN Take 1 tablet by mouth as needed.   Magnesium Oxide 500 MG (LAX) Tabs Take 1 tablet by mouth daily.   Melatonin 5 MG Caps Take 1 capsule by mouth at bedtime as needed.   omeprazole 40 MG capsule Commonly known as:  PRILOSEC TAKE 1 CAPSULE DAILY   pravastatin 20 MG tablet Commonly known as:  PRAVACHOL TAKE 1 TABLET DAILY   QUEtiapine 400 MG tablet Commonly known as:  SEROQUEL Take  1 tablet (400 mg total) by mouth at bedtime.   SENIOR MULTIVITAMIN PLUS PO Take by mouth.   sodium bicarbonate 650 MG tablet 650 mg 2 (two) times daily.   tamsulosin 0.4 MG Caps capsule Commonly known as:  FLOMAX TAKE 1 CAPSULE DAILY   Vitamin D3 2000 units Tabs Take 1 tablet by mouth daily.   vitamin E 1000 UNIT capsule Generic drug:  vitamin E Take 1,000 Units by mouth daily.         Today   CHIEF COMPLAINT:   Patient feels 100% better.  Wife is at bedside.  Confusion has improved.  Patient is at his baseline.   VITAL SIGNS:  Blood pressure 113/61, pulse 62, temperature 98.7 F (37.1 C), temperature source Oral, resp. rate 18, height 5\' 9"  (1.753 m), weight 104.3 kg, SpO2 98 %.   REVIEW OF SYSTEMS:  Review of Systems  Constitutional: Negative.  Negative for chills, fever and malaise/fatigue.  HENT: Negative.  Negative for ear discharge, ear pain, hearing loss, nosebleeds and sore throat.   Eyes: Negative.  Negative for blurred vision and pain.  Respiratory: Negative.  Negative for cough, hemoptysis, shortness of breath and wheezing.   Cardiovascular: Negative.  Negative for chest pain, palpitations and leg swelling.  Gastrointestinal: Negative.  Negative for abdominal pain, blood in stool, diarrhea, nausea and vomiting.  Genitourinary: Negative.  Negative for dysuria.  Musculoskeletal: Negative.  Negative for back pain.  Skin: Negative.   Neurological: Negative for dizziness, tremors, speech change, focal weakness, seizures and headaches.  Endo/Heme/Allergies: Negative.  Does not bruise/bleed easily.  Psychiatric/Behavioral: Positive for memory loss. Negative for depression, hallucinations and suicidal ideas.     PHYSICAL EXAMINATION:  GENERAL:  77 y.o.-year-old patient lying in the bed with no acute distress.  NECK:  Supple, no jugular venous distention. No thyroid enlargement, no tenderness.  LUNGS: Normal breath sounds bilaterally, no wheezing,  rales,rhonchi  No use of accessory muscles of respiration.  CARDIOVASCULAR: S1, S2 normal. No murmurs, rubs, or gallops.  ABDOMEN: Soft, non-tender, non-distended. Bowel sounds present. No organomegaly or mass.  EXTREMITIES: No pedal edema, cyanosis, or clubbing.  PSYCHIATRIC: The patient is alert and oriented x 3.  SKIN: No obvious rash, lesion, or ulcer.   DATA REVIEW:   CBC Recent Labs  Lab 01/21/18 0430  WBC 14.8*  HGB 11.3*  HCT 33.1*  PLT 227    Chemistries  Recent Labs  Lab 01/20/18 1445 01/21/18 0430  NA 139 141  K 4.1 3.3*  CL 101 107  CO2 25 26  GLUCOSE 135* 93  BUN 20 17  CREATININE 1.55* 1.10  CALCIUM 9.3 8.4*  AST 37  --   ALT 31  --   ALKPHOS 71  --   BILITOT 1.5*  --     Cardiac  Enzymes No results for input(s): TROPONINI in the last 168 hours.  Microbiology Results  @MICRORSLT48 @  RADIOLOGY:  Dg Chest 2 View  Result Date: 01/20/2018 CLINICAL DATA:  Assess for possible aspiration pneumonia EXAM: CHEST - 2 VIEW COMPARISON:  April 13, 2014 FINDINGS: The heart size and mediastinal contours are within normal limits. Pneumonia is identified in the left mid lung and left lung base. The right lung is clear. There is no pleural effusion or pulmonary edema. The visualized skeletal structures are unremarkable. IMPRESSION: Left mid lung and left lung base pneumonia. Electronically Signed   By: Abelardo Diesel M.D.   On: 01/20/2018 15:34      Allergies as of 01/21/2018      Reactions   Acetaminophen Other (See Comments)   Pancreatitis   Famotidine Other (See Comments)   Pancreatitis      Medication List    TAKE these medications   amLODipine 10 MG tablet Commonly known as:  NORVASC Take 10 mg by mouth daily.   ASACOL HD 800 MG Tbec Generic drug:  Mesalamine TAKE 2 TABLETS TWICE A DAY   aspirin 81 MG tablet Take by mouth.   azithromycin 500 MG tablet Commonly known as:  ZITHROMAX Take 1 tablet (500 mg total) by mouth daily for 3 days. Take  1 tablet daily for 3 days.   busPIRone 10 MG tablet Commonly known as:  BUSPAR Take 1 tablet (10 mg total) by mouth daily at 2 PM.   cefUROXime 500 MG tablet Commonly known as:  CEFTIN Take 1 tablet (500 mg total) by mouth 2 (two) times daily with a meal.   Cinnamon 500 MG capsule Take by mouth.   citalopram 10 MG tablet Commonly known as:  CELEXA Take 1 tablet (10 mg total) by mouth daily.   donepezil 10 MG tablet Commonly known as:  ARICEPT Take 1 tablet (10 mg total) by mouth at bedtime.   finasteride 5 MG tablet Commonly known as:  PROSCAR TAKE 1 TABLET DAILY   Fish Oil 1000 MG Caps Take by mouth.   hydrochlorothiazide 12.5 MG tablet Commonly known as:  HYDRODIURIL Take 1 tablet by mouth daily.   lamoTRIgine 25 MG tablet Commonly known as:  LAMICTAL Take 1 tablet (25 mg total) by mouth daily.   lisinopril 40 MG tablet Commonly known as:  PRINIVIL,ZESTRIL TAKE 1 TABLET EVERY MORNING FOR BLOOD PRESSURE   loratadine 10 MG tablet Commonly known as:  CLARITIN Take 1 tablet by mouth as needed.   Magnesium Oxide 500 MG (LAX) Tabs Take 1 tablet by mouth daily.   Melatonin 5 MG Caps Take 1 capsule by mouth at bedtime as needed.   omeprazole 40 MG capsule Commonly known as:  PRILOSEC TAKE 1 CAPSULE DAILY   pravastatin 20 MG tablet Commonly known as:  PRAVACHOL TAKE 1 TABLET DAILY   QUEtiapine 400 MG tablet Commonly known as:  SEROQUEL Take 1 tablet (400 mg total) by mouth at bedtime.   SENIOR MULTIVITAMIN PLUS PO Take by mouth.   sodium bicarbonate 650 MG tablet 650 mg 2 (two) times daily.   tamsulosin 0.4 MG Caps capsule Commonly known as:  FLOMAX TAKE 1 CAPSULE DAILY   Vitamin D3 2000 units Tabs Take 1 tablet by mouth daily.   vitamin E 1000 UNIT capsule Generic drug:  vitamin E Take 1,000 Units by mouth daily.          Management plans discussed with the patient and WIFE AND THEY is in agreement. Stable  for discharge   Patient should  follow up with PCP  CODE STATUS:     Code Status Orders  (From admission, onward)         Start     Ordered   01/20/18 1625  Full code  Continuous     01/20/18 1624        Code Status History    Date Active Date Inactive Code Status Order ID Comments User Context   01/20/2018 1624 01/20/2018 1624 Full Code [id number]  Nicholes Mango, MD ED      TOTAL TIME TAKING CARE OF THIS PATIENT: 38 minutes.    Note: This dictation was prepared with Dragon dictation along with smaller phrase technology. Any transcriptional errors that result from this process are unintentional.  Cozetta Seif M.D on 01/21/2018 at 8:19 AM  Between 7am to 6pm - Pager - [phone] After 6pm go to [email or web address] - password [email or web address] [facility]  Office  [phone]  CC: Primary care physician; Baxter Hire, MD

## 2018-01-21 NOTE — Care Management CC44 (Signed)
Condition Code 44 Documentation Completed  Patient Details  Name: Kurt Miller MRN: [id number] Date of Birth: December 28, 1940   Condition Code 44 given:  Yes Patient signature on Condition Code 44 notice:  Yes Documentation of 2 MD's agreement:  Yes Code 44 added to claim:  Yes    Shelbie Ammons, RN 01/21/2018, 2:08 PM

## 2018-01-21 NOTE — Care Management Note (Signed)
Case Management Note  Patient Details  Name: Kurt Miller MRN: [id number] Date of Birth: 1940-09-21  Subjective/Objective:  Admitted to [facility] with the diagnosis of sepsis. Lives with wife, Kurt Miller [phone]). Seen Dr. Harrel Lemon 01/20/18. Prescriptions are filled at [facility] [place] many years ago. No skilled facility.   No home oxygen. CPAP in the home. Takes care of all basic activities of daily living himself, doesn't drive. Wife does all the driving. No falls. Good appetite, no weight gain or loss.  Wife will transport               Action/Plan: Skilled nursing and physical therapy ordered per Dr. Benjie Karvonen, Discussed agencies. [place]. Will update [name], [facility] representative.   Expected Discharge Date:  01/21/18               Expected Discharge Plan:     In-House Referral:   yes  Discharge planning Services   yes  Post Acute Care Choice:   yes Choice offered to:   patient and wife  DME Arranged:    DME Agency:     HH Arranged:   yes HH Agency:   [facility]  Status of Service:     If discussed at [place] of Stay Meetings, dates discussed:    Additional Comments:  Shelbie Ammons, RN MSN CCM Care Management [phone] 01/21/2018, 9:19 AM

## 2018-01-21 NOTE — Care Management Obs Status (Signed)
[facility] NOTIFICATION   Patient Details  Name: Kurt Miller MRN: [id number] Date of Birth: 13-Dec-1940   Medicare Observation Status Notification Given:  Yes    Shelbie Ammons, RN 01/21/2018, 2:08 PM

## 2018-01-22 LAB — LEGIONELLA PNEUMOPHILA SEROGP 1 UR AG: L. PNEUMOPHILA SEROGP 1 UR AG: NEGATIVE

## 2018-01-22 LAB — HIV ANTIBODY (ROUTINE TESTING W REFLEX): HIV Screen 4th Generation wRfx: NONREACTIVE

## 2018-01-25 LAB — CULTURE, BLOOD (ROUTINE X 2)
Culture: NO GROWTH
Culture: NO GROWTH
SPECIAL REQUESTS: ADEQUATE

## 2018-10-25 ENCOUNTER — Other Ambulatory Visit: Payer: Self-pay

## 2018-10-26 ENCOUNTER — Other Ambulatory Visit: Payer: Self-pay

## 2018-10-26 ENCOUNTER — Inpatient Hospital Stay: Payer: Medicare Other | Attending: Oncology

## 2018-10-26 DIAGNOSIS — Z809 Family history of malignant neoplasm, unspecified: Secondary | ICD-10-CM | POA: Diagnosis not present

## 2018-10-26 DIAGNOSIS — R413 Other amnesia: Secondary | ICD-10-CM | POA: Insufficient documentation

## 2018-10-26 DIAGNOSIS — D472 Monoclonal gammopathy: Secondary | ICD-10-CM | POA: Diagnosis present

## 2018-10-26 LAB — CBC WITH DIFFERENTIAL/PLATELET
Abs Immature Granulocytes: 0.02 10*3/uL (ref 0.00–0.07)
Basophils Absolute: 0 10*3/uL (ref 0.0–0.1)
Basophils Relative: 1 %
Eosinophils Absolute: 0.1 10*3/uL (ref 0.0–0.5)
Eosinophils Relative: 2 %
HCT: 40.3 % (ref 39.0–52.0)
Hemoglobin: 13.4 g/dL (ref 13.0–17.0)
Immature Granulocytes: 0 %
Lymphocytes Relative: 31 %
Lymphs Abs: 1.7 10*3/uL (ref 0.7–4.0)
MCH: 29.8 pg (ref 26.0–34.0)
MCHC: 33.3 g/dL (ref 30.0–36.0)
MCV: 89.8 fL (ref 80.0–100.0)
Monocytes Absolute: 0.6 10*3/uL (ref 0.1–1.0)
Monocytes Relative: 10 %
Neutro Abs: 3.1 10*3/uL (ref 1.7–7.7)
Neutrophils Relative %: 56 %
Platelets: 301 10*3/uL (ref 150–400)
RBC: 4.49 MIL/uL (ref 4.22–5.81)
RDW: 13.8 % (ref 11.5–15.5)
WBC: 5.6 10*3/uL (ref 4.0–10.5)
nRBC: 0 % (ref 0.0–0.2)

## 2018-10-26 LAB — BASIC METABOLIC PANEL
Anion gap: 11 (ref 5–15)
BUN: 23 mg/dL (ref 8–23)
CO2: 24 mmol/L (ref 22–32)
Calcium: 9.4 mg/dL (ref 8.9–10.3)
Chloride: 105 mmol/L (ref 98–111)
Creatinine, Ser: 1.5 mg/dL — ABNORMAL HIGH (ref 0.61–1.24)
GFR calc Af Amer: 51 mL/min — ABNORMAL LOW (ref >60)
GFR calc non Af Amer: 44 mL/min — ABNORMAL LOW (ref >60)
Glucose, Bld: 141 mg/dL — ABNORMAL HIGH (ref 70–99)
Potassium: 3.8 mmol/L (ref 3.5–5.1)
Sodium: 140 mmol/L (ref 135–145)

## 2018-10-27 LAB — IGG, IGA, IGM
IgA: 162 mg/dL (ref 61–437)
IgG (Immunoglobin G), Serum: 880 mg/dL (ref 603–1613)
IgM (Immunoglobulin M), Srm: 32 mg/dL (ref 15–143)

## 2018-10-27 LAB — PROTEIN ELECTROPHORESIS, SERUM
A/G Ratio: 1.6 (ref 0.7–1.7)
Albumin ELP: 4.1 g/dL (ref 2.9–4.4)
Alpha-1-Globulin: 0.1 g/dL (ref 0.0–0.4)
Alpha-2-Globulin: 0.9 g/dL (ref 0.4–1.0)
Beta Globulin: 0.9 g/dL (ref 0.7–1.3)
Gamma Globulin: 0.7 g/dL (ref 0.4–1.8)
Globulin, Total: 2.6 g/dL (ref 2.2–3.9)
M-Spike, %: 0.2 g/dL — ABNORMAL HIGH
Total Protein ELP: 6.7 g/dL (ref 6.0–8.5)

## 2018-10-27 LAB — KAPPA/LAMBDA LIGHT CHAINS
Kappa free light chain: 26.2 mg/L — ABNORMAL HIGH (ref 3.3–19.4)
Kappa, lambda light chain ratio: 2.02 — ABNORMAL HIGH (ref 0.26–1.65)
Lambda free light chains: 13 mg/L (ref 5.7–26.3)

## 2018-10-30 NOTE — Progress Notes (Signed)
[facility]  [phone] [phone]  ID: Kurt Miller OB: 1940/11/22  MR#: [id number]  UYQ#:[id number]  Patient Care Team: Baxter Hire, MD as PCP - General (Internal Medicine)  CHIEF COMPLAINT: Iron deficiency anemia, MGUS  INTERVAL HISTORY: Patient returns to clinic today for repeat laboratory work and routine yearly evaluation.  He has worsening memory loss and his wife is on the phone who gives much of the history.  He currently feels well and is asymptomatic. He does not complain of weakness or fatigue. He has no neurologic complaints. He denies any recent fevers or illnesses. He has a good appetite and denies weight loss.  He denies any chest pain, shortness of breath, cough, or hemoptysis.  He denies any nausea, vomiting, constipation, or diarrhea. He has no urinary complaints.  Patient feels at his baseline offers no further specific complaints today.  REVIEW OF SYSTEMS:   Review of Systems  Constitutional: Negative.  Negative for fever, malaise/fatigue and weight loss.  Respiratory: Negative.  Negative for cough and shortness of breath.   Cardiovascular: Negative.  Negative for chest pain and leg swelling.  Gastrointestinal: Negative.  Negative for abdominal pain.  Genitourinary: Negative.  Negative for dysuria.  Musculoskeletal: Negative.  Negative for back pain.  Skin: Negative.  Negative for rash.  Neurological: Negative.  Negative for sensory change, focal weakness, weakness and headaches.  Psychiatric/Behavioral: Positive for memory loss. The patient is not nervous/anxious.     As per HPI. Otherwise, a complete review of systems is negative.  PAST MEDICAL HISTORY: Past Medical History:  Diagnosis Date  . Anemia   . Anxiety   . Cancer ([place])    Kidney  . Chronic kidney disease   . Depression   . Hypertension     PAST SURGICAL HISTORY: Past Surgical History:  Procedure Laterality Date  . KIDNEY SURGERY    . NOSE  SURGERY    . TONSILLECTOMY      FAMILY HISTORY: Family History  Problem Relation Age of Onset  . Cancer Mother   . Diabetes Mother   . Heart attack Father   . Hypertension Brother   . Stroke Brother   . ADD / ADHD Brother   . Hypertension Brother     ADVANCED DIRECTIVES (Y/N):  N  HEALTH MAINTENANCE: Social History   Tobacco Use  . Smoking status: Former Smoker    Packs/day: 0.25    Years: 10.00    Pack years: 2.50    Types: Cigarettes    Quit date: 05/12/1984    Years since quitting: 34.5  . Smokeless tobacco: Never Used  Substance Use Topics  . Alcohol use: No  . Drug use: No     Colonoscopy:  PAP:  Bone density:  Lipid panel:  Allergies  Allergen Reactions  . Acetaminophen Other (See Comments)    Pancreatitis  . Famotidine Other (See Comments)    Pancreatitis    Current Outpatient Medications  Medication Sig Dispense Refill  . amLODipine (NORVASC) 10 MG tablet Take 10 mg by mouth daily.     Marland Kitchen aspirin 81 MG tablet Take by mouth.    . busPIRone (BUSPAR) 10 MG tablet Take 1 tablet (10 mg total) by mouth daily at 2 PM. 90 tablet 1  . cefUROXime (CEFTIN) 500 MG tablet Take 1 tablet (500 mg total) by mouth 2 (two) times daily with a meal. 12 tablet 0  . Cholecalciferol (VITAMIN D3) 2000 units TABS Take 1 tablet by mouth daily.    Marland Kitchen  Cinnamon 500 MG capsule Take by mouth.    . citalopram (CELEXA) 10 MG tablet Take 1 tablet (10 mg total) by mouth daily. 90 tablet 1  . donepezil (ARICEPT) 10 MG tablet Take 1 tablet (10 mg total) by mouth at bedtime. 90 tablet 3  . finasteride (PROSCAR) 5 MG tablet TAKE 1 TABLET DAILY    . hydrochlorothiazide (HYDRODIURIL) 12.5 MG tablet Take 1 tablet by mouth daily.    Marland Kitchen lamoTRIgine (LAMICTAL) 25 MG tablet Take 1 tablet (25 mg total) by mouth daily. 90 tablet 1  . lisinopril (PRINIVIL,ZESTRIL) 40 MG tablet TAKE 1 TABLET EVERY MORNING FOR BLOOD PRESSURE    . loratadine (CLARITIN) 10 MG tablet Take 1 tablet by mouth as needed.     . Magnesium Oxide 500 MG (LAX) TABS Take 1 tablet by mouth daily.    . Melatonin 5 MG CAPS Take 1 capsule by mouth at bedtime as needed.    . Mesalamine (ASACOL HD) 800 MG TBEC TAKE 2 TABLETS TWICE A DAY    . Multiple Vitamins-Minerals (SENIOR MULTIVITAMIN PLUS PO) Take by mouth.    . Omega-3 Fatty Acids (FISH OIL) 1000 MG CAPS Take by mouth.    Marland Kitchen omeprazole (PRILOSEC) 40 MG capsule TAKE 1 CAPSULE DAILY    . pravastatin (PRAVACHOL) 20 MG tablet TAKE 1 TABLET DAILY    . QUEtiapine (SEROQUEL) 400 MG tablet Take 1 tablet (400 mg total) by mouth at bedtime. 90 tablet 1  . sodium bicarbonate 650 MG tablet 650 mg 2 (two) times daily.    . tamsulosin (FLOMAX) 0.4 MG CAPS capsule TAKE 1 CAPSULE DAILY    . vitamin E (VITAMIN E) 1000 UNIT capsule Take 1,000 Units by mouth daily.     No current facility-administered medications for this visit.     OBJECTIVE: There were no vitals filed for this visit.   There is no height or weight on file to calculate BMI.    ECOG FS:0 - Asymptomatic  General: Well-developed, well-nourished, no acute distress. Eyes: Pink conjunctiva, anicteric sclera. HEENT: Normocephalic, moist mucous membranes. Lungs: Clear to auscultation bilaterally. Heart: Regular rate and rhythm. No rubs, murmurs, or gallops. Abdomen: Soft, nontender, nondistended. No organomegaly noted, normoactive bowel sounds. Musculoskeletal: No edema, cyanosis, or clubbing. Neuro: Alert, answering all questions appropriately. Cranial nerves grossly intact. Skin: No rashes or petechiae noted. Psych: Normal affect.  LAB RESULTS:  Lab Results  Component Value Date   NA 140 10/26/2018   K 3.8 10/26/2018   CL 105 10/26/2018   CO2 24 10/26/2018   GLUCOSE 141 (H) 10/26/2018   BUN 23 10/26/2018   CREATININE 1.50 (H) 10/26/2018   CALCIUM 9.4 10/26/2018   PROT 7.4 01/20/2018   ALBUMIN 4.1 01/20/2018   AST 37 01/20/2018   ALT 31 01/20/2018   ALKPHOS 71 01/20/2018   BILITOT 1.5 (H) 01/20/2018    GFRNONAA 44 (L) 10/26/2018   GFRAA 51 (L) 10/26/2018    Lab Results  Component Value Date   WBC 5.6 10/26/2018   NEUTROABS 3.1 10/26/2018   HGB 13.4 10/26/2018   HCT 40.3 10/26/2018   MCV 89.8 10/26/2018   PLT 301 10/26/2018   Lab Results  Component Value Date   IRON 107 10/09/2016   TIBC 396 10/09/2016   IRONPCTSAT 27 10/09/2016   Lab Results  Component Value Date   FERRITIN 20 (L) 10/09/2016   Lab Results  Component Value Date   TOTALPROTELP 6.7 10/26/2018   ALBUMINELP 4.1 10/26/2018   A1GS  0.1 10/26/2018   A2GS 0.9 10/26/2018   BETS 0.9 10/26/2018   GAMS 0.7 10/26/2018   MSPIKE 0.2 (H) 10/26/2018   SPEI Comment 10/26/2018     STUDIES: No results found.  ASSESSMENT: Iron deficiency anemia, MGUS  PLAN:    1. Iron deficiency anemia: Resolved.  Patient's hemoglobin continues to be within normal limits he has a mildly decreased ferritin.  Patient has never required intervention with IV iron.  Monitor.  2. MGUS: Patient's M spike continues to be present at 0.2, but relatively unchanged over 1 year ago when it was reported at 0.3.  He continues to have a mildly elevated kappa free light chains, but immunoglobulins are all within normal limits.  He has no evidence of endorgan damage.  He is at low risk for progressing to multiple myeloma.  He does not require bone marrow biopsy.  Return to clinic in 1 year for further evaluation.  If his M spike remained stable, patient will be greater than 57 years old, can discontinue monitoring and discharge patient from clinic. 3.  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 This was only seen and 9% of leukocytes. In the setting of a normal white blood cell count and patient asymptomatic, no further intervention is needed.  Repeat flow cytometry in 1 year at next clinic visit.   Patient expressed  understanding and was in agreement with this plan. He also understands that He can call clinic at any time with any questions, concerns, or complaints.    Lloyd Huger, MD   11/04/2018 6:34 AM

## 2018-11-02 ENCOUNTER — Other Ambulatory Visit: Payer: Self-pay

## 2018-11-02 ENCOUNTER — Inpatient Hospital Stay (HOSPITAL_BASED_OUTPATIENT_CLINIC_OR_DEPARTMENT_OTHER): Payer: Medicare Other | Admitting: Oncology

## 2018-11-02 DIAGNOSIS — R413 Other amnesia: Secondary | ICD-10-CM | POA: Diagnosis not present

## 2018-11-02 DIAGNOSIS — Z809 Family history of malignant neoplasm, unspecified: Secondary | ICD-10-CM

## 2018-11-02 DIAGNOSIS — D472 Monoclonal gammopathy: Secondary | ICD-10-CM

## 2018-11-02 NOTE — Progress Notes (Signed)
Patient denies any concerns today.  

## 2018-11-03 ENCOUNTER — Other Ambulatory Visit: Payer: Self-pay

## 2019-04-26 ENCOUNTER — Other Ambulatory Visit: Payer: Self-pay | Admitting: Sports Medicine

## 2019-04-26 DIAGNOSIS — M79604 Pain in right leg: Secondary | ICD-10-CM

## 2019-04-26 DIAGNOSIS — M5136 Other intervertebral disc degeneration, lumbar region: Secondary | ICD-10-CM

## 2019-04-26 DIAGNOSIS — M47816 Spondylosis without myelopathy or radiculopathy, lumbar region: Secondary | ICD-10-CM

## 2019-04-30 ENCOUNTER — Ambulatory Visit
Admission: RE | Admit: 2019-04-30 | Discharge: 2019-04-30 | Disposition: A | Discharge status: Home / Self Care | Payer: Medicare Other | Source: Ambulatory Visit | Attending: Sports Medicine | Admitting: Sports Medicine

## 2019-04-30 DIAGNOSIS — M79604 Pain in right leg: Secondary | ICD-10-CM | POA: Diagnosis not present

## 2019-04-30 DIAGNOSIS — M5136 Other intervertebral disc degeneration, lumbar region: Secondary | ICD-10-CM | POA: Diagnosis present

## 2019-04-30 DIAGNOSIS — M47816 Spondylosis without myelopathy or radiculopathy, lumbar region: Secondary | ICD-10-CM | POA: Diagnosis present

## 2019-04-30 DIAGNOSIS — M51369 Other intervertebral disc degeneration, lumbar region without mention of lumbar back pain or lower extremity pain: Secondary | ICD-10-CM

## 2019-04-30 DIAGNOSIS — M79605 Pain in left leg: Secondary | ICD-10-CM | POA: Diagnosis present

## 2019-05-04 ENCOUNTER — Other Ambulatory Visit: Payer: Self-pay | Admitting: Sports Medicine

## 2019-05-04 DIAGNOSIS — M47816 Spondylosis without myelopathy or radiculopathy, lumbar region: Secondary | ICD-10-CM

## 2019-05-05 ENCOUNTER — Ambulatory Visit: Payer: Medicare Other

## 2019-05-10 ENCOUNTER — Ambulatory Visit
Admission: RE | Admit: 2019-05-10 | Discharge: 2019-05-10 | Disposition: A | Discharge status: Home / Self Care | Payer: Medicare Other | Source: Ambulatory Visit | Attending: Sports Medicine | Admitting: Sports Medicine

## 2019-05-10 ENCOUNTER — Other Ambulatory Visit: Payer: Self-pay | Admitting: Sports Medicine

## 2019-05-10 ENCOUNTER — Other Ambulatory Visit: Payer: Self-pay

## 2019-05-10 DIAGNOSIS — M47816 Spondylosis without myelopathy or radiculopathy, lumbar region: Secondary | ICD-10-CM

## 2019-05-10 MED ORDER — METHYLPREDNISOLONE ACETATE 40 MG/ML INJ SUSP (RADIOLOG
120.0000 mg | Freq: Once | INTRAMUSCULAR | Status: AC
  Administered 2019-05-10: 120 mg via EPIDURAL

## 2019-05-10 MED ORDER — IOPAMIDOL (ISOVUE-M 200) INJECTION 41%
1.0000 mL | Freq: Once | INTRAMUSCULAR | Status: AC
  Administered 2019-05-10: 1 mL via EPIDURAL

## 2019-05-10 NOTE — Discharge Instructions (Signed)

## 2019-10-25 ENCOUNTER — Inpatient Hospital Stay: Payer: Medicare Other | Attending: Oncology

## 2019-10-25 ENCOUNTER — Other Ambulatory Visit: Payer: Self-pay

## 2019-10-25 DIAGNOSIS — D472 Monoclonal gammopathy: Secondary | ICD-10-CM | POA: Insufficient documentation

## 2019-10-25 DIAGNOSIS — Z87891 Personal history of nicotine dependence: Secondary | ICD-10-CM | POA: Diagnosis not present

## 2019-10-25 DIAGNOSIS — Z8249 Family history of ischemic heart disease and other diseases of the circulatory system: Secondary | ICD-10-CM | POA: Diagnosis not present

## 2019-10-25 DIAGNOSIS — Z833 Family history of diabetes mellitus: Secondary | ICD-10-CM | POA: Diagnosis not present

## 2019-10-25 DIAGNOSIS — Z79899 Other long term (current) drug therapy: Secondary | ICD-10-CM | POA: Insufficient documentation

## 2019-10-25 DIAGNOSIS — Z823 Family history of stroke: Secondary | ICD-10-CM | POA: Diagnosis not present

## 2019-10-25 DIAGNOSIS — Z818 Family history of other mental and behavioral disorders: Secondary | ICD-10-CM | POA: Diagnosis not present

## 2019-10-25 DIAGNOSIS — Z809 Family history of malignant neoplasm, unspecified: Secondary | ICD-10-CM | POA: Insufficient documentation

## 2019-10-25 DIAGNOSIS — R413 Other amnesia: Secondary | ICD-10-CM | POA: Diagnosis not present

## 2019-10-25 DIAGNOSIS — D509 Iron deficiency anemia, unspecified: Secondary | ICD-10-CM | POA: Diagnosis present

## 2019-10-25 DIAGNOSIS — C9110 Chronic lymphocytic leukemia of B-cell type not having achieved remission: Secondary | ICD-10-CM | POA: Diagnosis not present

## 2019-10-25 LAB — BASIC METABOLIC PANEL
Anion gap: 8 (ref 5–15)
BUN: 23 mg/dL (ref 8–23)
CO2: 28 mmol/L (ref 22–32)
Calcium: 9.1 mg/dL (ref 8.9–10.3)
Chloride: 105 mmol/L (ref 98–111)
Creatinine, Ser: 1.11 mg/dL (ref 0.61–1.24)
GFR calc Af Amer: >60 mL/min (ref >60)
GFR calc non Af Amer: >60 mL/min (ref >60)
Glucose, Bld: 116 mg/dL — ABNORMAL HIGH (ref 70–99)
Potassium: 3.5 mmol/L (ref 3.5–5.1)
Sodium: 141 mmol/L (ref 135–145)

## 2019-10-25 LAB — CBC WITH DIFFERENTIAL/PLATELET
Abs Immature Granulocytes: 0.02 10*3/uL (ref 0.00–0.07)
Basophils Absolute: 0 10*3/uL (ref 0.0–0.1)
Basophils Relative: 1 %
Eosinophils Absolute: 0 10*3/uL (ref 0.0–0.5)
Eosinophils Relative: 0 %
HCT: 36 % — ABNORMAL LOW (ref 39.0–52.0)
Hemoglobin: 12.5 g/dL — ABNORMAL LOW (ref 13.0–17.0)
Immature Granulocytes: 1 %
Lymphocytes Relative: 49 %
Lymphs Abs: 2.2 10*3/uL (ref 0.7–4.0)
MCH: 30.9 pg (ref 26.0–34.0)
MCHC: 34.7 g/dL (ref 30.0–36.0)
MCV: 88.9 fL (ref 80.0–100.0)
Monocytes Absolute: 0.5 10*3/uL (ref 0.1–1.0)
Monocytes Relative: 12 %
Neutro Abs: 1.6 10*3/uL — ABNORMAL LOW (ref 1.7–7.7)
Neutrophils Relative %: 37 %
Platelets: 261 10*3/uL (ref 150–400)
RBC: 4.05 MIL/uL — ABNORMAL LOW (ref 4.22–5.81)
RDW: 14.3 % (ref 11.5–15.5)
WBC: 4.3 10*3/uL (ref 4.0–10.5)
nRBC: 0 % (ref 0.0–0.2)

## 2019-10-26 LAB — PROTEIN ELECTROPHORESIS, SERUM
A/G Ratio: 1.5 (ref 0.7–1.7)
Albumin ELP: 4 g/dL (ref 2.9–4.4)
Alpha-1-Globulin: 0.2 g/dL (ref 0.0–0.4)
Alpha-2-Globulin: 0.8 g/dL (ref 0.4–1.0)
Beta Globulin: 1 g/dL (ref 0.7–1.3)
Gamma Globulin: 0.7 g/dL (ref 0.4–1.8)
Globulin, Total: 2.7 g/dL (ref 2.2–3.9)
M-Spike, %: 0.2 g/dL — ABNORMAL HIGH
Total Protein ELP: 6.7 g/dL (ref 6.0–8.5)

## 2019-10-26 LAB — IGG, IGA, IGM
IgA: 156 mg/dL (ref 61–437)
IgG (Immunoglobin G), Serum: 789 mg/dL (ref 603–1613)
IgM (Immunoglobulin M), Srm: 39 mg/dL (ref 15–143)

## 2019-10-26 LAB — KAPPA/LAMBDA LIGHT CHAINS
Kappa free light chain: 29 mg/L — ABNORMAL HIGH (ref 3.3–19.4)
Kappa, lambda light chain ratio: 1.92 — ABNORMAL HIGH (ref 0.26–1.65)
Lambda free light chains: 15.1 mg/L (ref 5.7–26.3)

## 2019-10-29 NOTE — Progress Notes (Signed)
[place]  [phone] [phone]) [phone]  ID: Kurt Miller OB: 23-Nov-1940  MR#: [id number]  IFO#:[id number]  Patient Care Team: Baxter Hire, MD as PCP - General (Internal Medicine)  CHIEF COMPLAINT: Iron deficiency anemia, MGUS  INTERVAL HISTORY: Patient returns to clinic today for discussion of his laboratory work and routine yearly evaluation. He continues to feel well and is asymptomatic. He does not complain of weakness or fatigue. He has no neurologic complaints. He denies any recent fevers or illnesses. He has a good appetite and denies weight loss.  He denies any chest pain, shortness of breath, cough, or hemoptysis.  He denies any nausea, vomiting, constipation, or diarrhea. He has no urinary complaints. Patient offers no specific complaints today.  REVIEW OF SYSTEMS:   Review of Systems  Constitutional: Negative.  Negative for fever, malaise/fatigue and weight loss.  Respiratory: Negative.  Negative for cough and shortness of breath.   Cardiovascular: Negative.  Negative for chest pain and leg swelling.  Gastrointestinal: Negative.  Negative for abdominal pain.  Genitourinary: Negative.  Negative for dysuria.  Musculoskeletal: Negative.  Negative for back pain.  Skin: Negative.  Negative for rash.  Neurological: Negative.  Negative for sensory change, focal weakness, weakness and headaches.  Psychiatric/Behavioral: Positive for memory loss. The patient is not nervous/anxious.     As per HPI. Otherwise, a complete review of systems is negative.  PAST MEDICAL HISTORY: Past Medical History:  Diagnosis Date  . Anemia   . Anxiety   . Cancer ([place])    Kidney  . Chronic kidney disease   . Depression   . Hypertension     PAST SURGICAL HISTORY: Past Surgical History:  Procedure Laterality Date  . KIDNEY SURGERY    . NOSE SURGERY    . TONSILLECTOMY      FAMILY HISTORY: Family History  Problem Relation Age of Onset  . Cancer  Mother   . Diabetes Mother   . Heart attack Father   . Hypertension Brother   . Stroke Brother   . ADD / ADHD Brother   . Hypertension Brother     ADVANCED DIRECTIVES (Y/N):  N  HEALTH MAINTENANCE: Social History   Tobacco Use  . Smoking status: Former Smoker    Packs/day: 0.25    Years: 10.00    Pack years: 2.50    Types: Cigarettes    Quit date: 05/12/1984    Years since quitting: 35.5  . Smokeless tobacco: Never Used  Vaping Use  . Vaping Use: Never used  Substance Use Topics  . Alcohol use: No  . Drug use: No     Colonoscopy:  PAP:  Bone density:  Lipid panel:  Allergies  Allergen Reactions  . Acetaminophen Other (See Comments)    Pancreatitis  . Famotidine Other (See Comments)    Pancreatitis    Current Outpatient Medications  Medication Sig Dispense Refill  . amLODipine (NORVASC) 10 MG tablet Take 10 mg by mouth daily.     Marland Kitchen aspirin 81 MG tablet Take by mouth.    . busPIRone (BUSPAR) 10 MG tablet Take 1 tablet (10 mg total) by mouth daily at 2 PM. 90 tablet 1  . cefUROXime (CEFTIN) 500 MG tablet Take 1 tablet (500 mg total) by mouth 2 (two) times daily with a meal. 12 tablet 0  . Cholecalciferol (VITAMIN D3) 2000 units TABS Take 1 tablet by mouth daily.    . Cinnamon 500 MG capsule Take by mouth.    Marland Kitchen  citalopram (CELEXA) 10 MG tablet Take 1 tablet (10 mg total) by mouth daily. 90 tablet 1  . donepezil (ARICEPT) 10 MG tablet Take 1 tablet (10 mg total) by mouth at bedtime. 90 tablet 3  . finasteride (PROSCAR) 5 MG tablet TAKE 1 TABLET DAILY    . hydrochlorothiazide (HYDRODIURIL) 12.5 MG tablet Take 1 tablet by mouth daily.    Marland Kitchen lamoTRIgine (LAMICTAL) 25 MG tablet Take 1 tablet (25 mg total) by mouth daily. 90 tablet 1  . lisinopril (PRINIVIL,ZESTRIL) 40 MG tablet TAKE 1 TABLET EVERY MORNING FOR BLOOD PRESSURE    . loratadine (CLARITIN) 10 MG tablet Take 1 tablet by mouth as needed.    . Magnesium Oxide 500 MG (LAX) TABS Take 1 tablet by mouth daily.    .  Melatonin 5 MG CAPS Take 1 capsule by mouth at bedtime as needed.    . Mesalamine (ASACOL HD) 800 MG TBEC TAKE 2 TABLETS TWICE A DAY    . Multiple Vitamins-Minerals (SENIOR MULTIVITAMIN PLUS PO) Take by mouth.    . Omega-3 Fatty Acids (FISH OIL) 1000 MG CAPS Take by mouth.    Marland Kitchen omeprazole (PRILOSEC) 40 MG capsule TAKE 1 CAPSULE DAILY    . pravastatin (PRAVACHOL) 20 MG tablet TAKE 1 TABLET DAILY    . QUEtiapine (SEROQUEL) 400 MG tablet Take 1 tablet (400 mg total) by mouth at bedtime. 90 tablet 1  . sodium bicarbonate 650 MG tablet 650 mg 2 (two) times daily.    . tamsulosin (FLOMAX) 0.4 MG CAPS capsule TAKE 1 CAPSULE DAILY    . vitamin E (VITAMIN E) 1000 UNIT capsule Take 1,000 Units by mouth daily.     No current facility-administered medications for this visit.    OBJECTIVE: Vitals:   11/01/19 1409  BP: 107/71  Pulse: 91  Resp: 20  Temp: (!) 97.1 F (36.2 C)  SpO2: 97%     Body mass index is 30.66 kg/m.    ECOG FS:0 - Asymptomatic  General: Well-developed, well-nourished, no acute distress. Eyes: Pink conjunctiva, anicteric sclera. HEENT: Normocephalic, moist mucous membranes. Lungs: No audible wheezing or coughing. Heart: Regular rate and rhythm. Abdomen: Soft, nontender, no obvious distention. Musculoskeletal: No edema, cyanosis, or clubbing. Neuro: Alert, answering all questions appropriately. Cranial nerves grossly intact. Skin: No rashes or petechiae noted. Psych: Normal affect.   LAB RESULTS:  Lab Results  Component Value Date   NA 141 10/25/2019   K 3.5 10/25/2019   CL 105 10/25/2019   CO2 28 10/25/2019   GLUCOSE 116 (H) 10/25/2019   BUN 23 10/25/2019   CREATININE 1.11 10/25/2019   CALCIUM 9.1 10/25/2019   PROT 7.4 01/20/2018   ALBUMIN 4.1 01/20/2018   AST 37 01/20/2018   ALT 31 01/20/2018   ALKPHOS 71 01/20/2018   BILITOT 1.5 (H) 01/20/2018   GFRNONAA >60 10/25/2019   GFRAA >60 10/25/2019    Lab Results  Component Value Date   WBC 4.3 10/25/2019    NEUTROABS 1.6 (L) 10/25/2019   HGB 12.5 (L) 10/25/2019   HCT 36.0 (L) 10/25/2019   MCV 88.9 10/25/2019   PLT 261 10/25/2019   Lab Results  Component Value Date   IRON 107 10/09/2016   TIBC 396 10/09/2016   IRONPCTSAT 27 10/09/2016   Lab Results  Component Value Date   FERRITIN 20 (L) 10/09/2016   Lab Results  Component Value Date   TOTALPROTELP 6.7 10/25/2019   ALBUMINELP 4.0 10/25/2019   A1GS 0.2 10/25/2019   A2GS 0.8  10/25/2019   BETS 1.0 10/25/2019   GAMS 0.7 10/25/2019   MSPIKE 0.2 (H) 10/25/2019   SPEI Comment 10/25/2019     STUDIES: No results found.  ASSESSMENT: Iron deficiency anemia, MGUS  PLAN:    1. Iron deficiency anemia: Resolved.  Patient's hemoglobin continues to be essentially within normal limits. No interventions is needed at this time. He has never required intervention with IV iron.  Monitor.  2. MGUS: Chronic and unchanged.  Patient's M spike is stable at 0.2.  He continues to have mildly elevated kappa free light chains at 29.0, but immunoglobulins are all within normal limits.  He has no evidence of endorgan damage.  He is at low risk for progressing to multiple myeloma.  He does not require bone marrow biopsy.  Return to clinic in one year for routine evaluation. 3.  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  FISH could be used to further define this monoclonal population and will be drawn with next blood work. This is only seen in 10% of leukocytes which is unchanged from a year ago when it ws reported in 9%.  In the setting of a normal white blood cell count and patient asymptomatic, no further intervention is needed.  Repeat flow cytometry in 1 year at next clinic visit.  I spent a total of 20 minutes reviewing chart data, face-to-face evaluation with the patient, counseling and coordination of care as detailed  above.   Patient expressed understanding and was in agreement with this plan. He also understands that He can call clinic at any time with any questions, concerns, or complaints.    Lloyd Huger, MD   11/04/2019 7:43 AM

## 2019-10-31 LAB — COMP PANEL: LEUKEMIA/LYMPHOMA: Immunophenotypic Profile: 10

## 2019-11-01 ENCOUNTER — Inpatient Hospital Stay (HOSPITAL_BASED_OUTPATIENT_CLINIC_OR_DEPARTMENT_OTHER): Payer: Medicare Other | Admitting: Oncology

## 2019-11-01 ENCOUNTER — Encounter: Payer: Self-pay | Admitting: Oncology

## 2019-11-01 ENCOUNTER — Other Ambulatory Visit: Payer: Self-pay

## 2019-11-01 VITALS — BP 107/71 | HR 91 | Temp 97.1°F | Resp 20 | Wt 207.6 lb

## 2019-11-01 DIAGNOSIS — D509 Iron deficiency anemia, unspecified: Secondary | ICD-10-CM | POA: Diagnosis not present

## 2019-11-01 DIAGNOSIS — D472 Monoclonal gammopathy: Secondary | ICD-10-CM

## 2020-10-18 ENCOUNTER — Inpatient Hospital Stay: Payer: Medicare Other | Attending: Oncology

## 2020-10-18 DIAGNOSIS — F17210 Nicotine dependence, cigarettes, uncomplicated: Secondary | ICD-10-CM | POA: Diagnosis not present

## 2020-10-18 DIAGNOSIS — D472 Monoclonal gammopathy: Secondary | ICD-10-CM | POA: Insufficient documentation

## 2020-10-18 DIAGNOSIS — D509 Iron deficiency anemia, unspecified: Secondary | ICD-10-CM | POA: Diagnosis not present

## 2020-10-18 LAB — CBC WITH DIFFERENTIAL/PLATELET
Abs Immature Granulocytes: 0.01 10*3/uL (ref 0.00–0.07)
Basophils Absolute: 0 10*3/uL (ref 0.0–0.1)
Basophils Relative: 1 %
Eosinophils Absolute: 0 10*3/uL (ref 0.0–0.5)
Eosinophils Relative: 0 %
HCT: 37 % — ABNORMAL LOW (ref 39.0–52.0)
Hemoglobin: 12.8 g/dL — ABNORMAL LOW (ref 13.0–17.0)
Immature Granulocytes: 0 %
Lymphocytes Relative: 47 %
Lymphs Abs: 2.4 10*3/uL (ref 0.7–4.0)
MCH: 30.5 pg (ref 26.0–34.0)
MCHC: 34.6 g/dL (ref 30.0–36.0)
MCV: 88.1 fL (ref 80.0–100.0)
Monocytes Absolute: 0.4 10*3/uL (ref 0.1–1.0)
Monocytes Relative: 9 %
Neutro Abs: 2.2 10*3/uL (ref 1.7–7.7)
Neutrophils Relative %: 43 %
Platelets: 296 10*3/uL (ref 150–400)
RBC: 4.2 MIL/uL — ABNORMAL LOW (ref 4.22–5.81)
RDW: 14.6 % (ref 11.5–15.5)
WBC: 5 10*3/uL (ref 4.0–10.5)
nRBC: 0 % (ref 0.0–0.2)

## 2020-10-18 LAB — BASIC METABOLIC PANEL
Anion gap: 13 (ref 5–15)
BUN: 10 mg/dL (ref 8–23)
CO2: 23 mmol/L (ref 22–32)
Calcium: 8 mg/dL — ABNORMAL LOW (ref 8.9–10.3)
Chloride: 102 mmol/L (ref 98–111)
Creatinine, Ser: 1.29 mg/dL — ABNORMAL HIGH (ref 0.61–1.24)
GFR, Estimated: 56 mL/min — ABNORMAL LOW (ref >60)
Glucose, Bld: 137 mg/dL — ABNORMAL HIGH (ref 70–99)
Potassium: 2.7 mmol/L — CL (ref 3.5–5.1)
Sodium: 138 mmol/L (ref 135–145)

## 2020-10-18 LAB — IRON AND TIBC
Iron: 72 ug/dL (ref 45–182)
Saturation Ratios: 15 % — ABNORMAL LOW (ref 17.9–39.5)
TIBC: 470 ug/dL — ABNORMAL HIGH (ref 250–450)
UIBC: 398 ug/dL

## 2020-10-18 LAB — FERRITIN: Ferritin: 12 ng/mL — ABNORMAL LOW (ref 24–336)

## 2020-10-19 ENCOUNTER — Other Ambulatory Visit: Payer: Self-pay | Admitting: Nurse Practitioner

## 2020-10-19 ENCOUNTER — Telehealth: Payer: Self-pay

## 2020-10-19 LAB — IGG, IGA, IGM
IgA: 139 mg/dL (ref 61–437)
IgG (Immunoglobin G), Serum: 849 mg/dL (ref 603–1613)
IgM (Immunoglobulin M), Srm: 31 mg/dL (ref 15–143)

## 2020-10-19 MED ORDER — POTASSIUM CHLORIDE CRYS ER 20 MEQ PO TBCR: 20.0000 meq | EXTENDED_RELEASE_TABLET | Freq: Two times a day (BID) | ORAL | 0 refills | Status: AC

## 2020-10-19 NOTE — Telephone Encounter (Signed)
Patient's wife notified of low potassium lab results yesterday and that Lauren A, NP sent in a prescription for potassium to his pharmacy.  She was actually at the pharmacy during our call and will pick up the K+ rx.  Also stated that his PCP, Dr. Edwina Barth at [facility], follows his potassium as well and sent  him a 2 week supply recently.  I will forward the lab results and inform Dr. Edwina Barth that Lauren sent in rx for K+.

## 2020-10-21 LAB — KAPPA/LAMBDA LIGHT CHAINS
Kappa free light chain: 25.9 mg/L — ABNORMAL HIGH (ref 3.3–19.4)
Kappa, lambda light chain ratio: 1.85 — ABNORMAL HIGH (ref 0.26–1.65)
Lambda free light chains: 14 mg/L (ref 5.7–26.3)

## 2020-10-22 LAB — COMP PANEL: LEUKEMIA/LYMPHOMA: Immunophenotypic Profile: 11

## 2020-10-22 LAB — PROTEIN ELECTROPHORESIS, SERUM
A/G Ratio: 1.5 (ref 0.7–1.7)
Albumin ELP: 4.2 g/dL (ref 2.9–4.4)
Alpha-1-Globulin: 0.2 g/dL (ref 0.0–0.4)
Alpha-2-Globulin: 0.9 g/dL (ref 0.4–1.0)
Beta Globulin: 1 g/dL (ref 0.7–1.3)
Gamma Globulin: 0.8 g/dL (ref 0.4–1.8)
Globulin, Total: 2.8 g/dL (ref 2.2–3.9)
M-Spike, %: 0.2 g/dL — ABNORMAL HIGH
Total Protein ELP: 7 g/dL (ref 6.0–8.5)

## 2020-10-23 LAB — FISH HES LEUKEMIA, 4Q12 REA

## 2020-10-25 ENCOUNTER — Encounter: Payer: Self-pay | Admitting: Nurse Practitioner

## 2020-10-25 ENCOUNTER — Other Ambulatory Visit: Payer: Self-pay

## 2020-10-25 ENCOUNTER — Inpatient Hospital Stay (HOSPITAL_BASED_OUTPATIENT_CLINIC_OR_DEPARTMENT_OTHER): Payer: Medicare Other | Admitting: Nurse Practitioner

## 2020-10-25 VITALS — BP 108/71 | HR 95 | Temp 97.7°F | Resp 18

## 2020-10-25 DIAGNOSIS — C9110 Chronic lymphocytic leukemia of B-cell type not having achieved remission: Secondary | ICD-10-CM | POA: Diagnosis not present

## 2020-10-25 DIAGNOSIS — D509 Iron deficiency anemia, unspecified: Secondary | ICD-10-CM | POA: Diagnosis not present

## 2020-10-25 DIAGNOSIS — D472 Monoclonal gammopathy: Secondary | ICD-10-CM

## 2020-10-25 NOTE — Progress Notes (Signed)
[place]  Telephone:(336) [phone] [phone]) [phone]  ID: Kurt Miller OB: June 21, 1940  MR#: [id number]  BTD#:[id number]  Patient Care Team: Baxter Hire, MD as PCP - General (Internal Medicine)  CHIEF COMPLAINT: Iron deficiency anemia, MGUS  INTERVAL HISTORY: Patient is 80 year old male who returns to clinic for discussion of his lab work and routine yearly evaluation.  His wife provides majority of history.  He has some recent tiredness and shortness of breath with exertion.  No dizziness or falls.  No recent fevers or illness.  Appetite is good and denies weight loss.  No chest pain, cough, hemoptysis.  No nausea, vomiting, constipation, diarrhea.  No melena or hematochezia.  No urinary complaints.  No further specific complaints today.   REVIEW OF SYSTEMS:   Review of Systems  Constitutional:  Positive for malaise/fatigue. Negative for fever and weight loss.  Respiratory:  Positive for shortness of breath. Negative for cough.   Cardiovascular: Negative.  Negative for chest pain and leg swelling.  Gastrointestinal: Negative.  Negative for abdominal pain.  Genitourinary: Negative.  Negative for dysuria.  Musculoskeletal: Negative.  Negative for back pain.  Skin: Negative.  Negative for rash.  Neurological: Negative.  Negative for sensory change, focal weakness, weakness and headaches.  Psychiatric/Behavioral:  Positive for memory loss. The patient is not nervous/anxious.   As per HPI. Otherwise, a complete review of systems is negative.  PAST MEDICAL HISTORY: Past Medical History:  Diagnosis Date   Anemia    Anxiety    Cancer ([place])    Kidney   Chronic kidney disease    Depression    Hypertension     PAST SURGICAL HISTORY: Past Surgical History:  Procedure Laterality Date   KIDNEY SURGERY     NOSE SURGERY     TONSILLECTOMY      FAMILY HISTORY: Family History  Problem Relation Age of Onset   Cancer Mother    Diabetes Mother    Heart  attack Father    Hypertension Brother    Stroke Brother    ADD / ADHD Brother    Hypertension Brother     ADVANCED DIRECTIVES (Y/N):  N  HEALTH MAINTENANCE: Social History   Tobacco Use   Smoking status: Former    Packs/day: 0.25    Years: 10.00    Pack years: 2.50    Types: Cigarettes    Quit date: 05/12/1984    Years since quitting: 36.4   Smokeless tobacco: Never  Vaping Use   Vaping Use: Never used  Substance Use Topics   Alcohol use: No   Drug use: No    Colonoscopy:  Bone density:  Lipid panel:  Allergies  Allergen Reactions   Acetaminophen Other (See Comments)    Pancreatitis   Famotidine Other (See Comments)    Pancreatitis    Current Outpatient Medications  Medication Sig Dispense Refill   amLODipine (NORVASC) 10 MG tablet Take 10 mg by mouth daily.      Calcium Polycarbophil (FIBER-CAPS PO) Take 2 capsules by mouth daily.     Cholecalciferol (VITAMIN D3) 2000 units TABS Take 1 tablet by mouth daily.     finasteride (PROSCAR) 5 MG tablet TAKE 1 TABLET DAILY     hydrochlorothiazide (HYDRODIURIL) 12.5 MG tablet Take 1 tablet by mouth daily.     lisinopril (PRINIVIL,ZESTRIL) 40 MG tablet TAKE 1 TABLET EVERY MORNING FOR BLOOD PRESSURE     Magnesium Oxide 500 MG (LAX) TABS Take 1 tablet by mouth daily.  Melatonin 5 MG CAPS Take 1 capsule by mouth at bedtime as needed.     Mesalamine 800 MG TBEC TAKE 2 TABLETS TWICE A DAY     Multiple Vitamins-Minerals (SENIOR MULTIVITAMIN PLUS PO) Take by mouth.     Omega-3 Fatty Acids (FISH OIL) 1000 MG CAPS Take by mouth.     omeprazole (PRILOSEC) 40 MG capsule TAKE 1 CAPSULE DAILY     potassium chloride SA (KLOR-CON) 20 MEQ tablet Take 1 tablet (20 mEq total) by mouth 2 (two) times daily. 1 pill twice a day 60 tablet 0   pravastatin (PRAVACHOL) 20 MG tablet TAKE 1 TABLET DAILY     QUEtiapine (SEROQUEL) 400 MG tablet Take 1 tablet (400 mg total) by mouth at bedtime. 90 tablet 1   sodium bicarbonate 650 MG tablet 650 mg  2 (two) times daily.     tamsulosin (FLOMAX) 0.4 MG CAPS capsule TAKE 1 CAPSULE DAILY     vitamin E 1000 UNIT capsule Take 1,000 Units by mouth daily.     No current facility-administered medications for this visit.    OBJECTIVE: Vitals:   10/25/20 1051  BP: 108/71  Pulse: 95  Resp: 18  Temp: 97.7 F (36.5 C)  SpO2: 99%      There is no height or weight on file to calculate BMI.     ECOG FS:1 - Symptomatic but completely ambulatory  General: Well-developed, well-nourished, no acute distress. Eyes: Pink conjunctiva, anicteric sclera. Lungs: Clear to auscultation bilaterally.  No audible wheezing or coughing Heart: Regular rate and rhythm.  Abdomen: Soft, nontender, nondistended.  Musculoskeletal: No edema, cyanosis, or clubbing. Neuro: Alert, answering all questions appropriately. Cranial nerves grossly intact. Skin: No rashes or petechiae noted. Psych: Normal affect. Memory loss.    LAB RESULTS:  Lab Results  Component Value Date   NA 138 10/18/2020   K 2.7 (LL) 10/18/2020   CL 102 10/18/2020   CO2 23 10/18/2020   GLUCOSE 137 (H) 10/18/2020   BUN 10 10/18/2020   CREATININE 1.29 (H) 10/18/2020   CALCIUM 8.0 (L) 10/18/2020   PROT 7.4 01/20/2018   ALBUMIN 4.1 01/20/2018   AST 37 01/20/2018   ALT 31 01/20/2018   ALKPHOS 71 01/20/2018   BILITOT 1.5 (H) 01/20/2018   GFRNONAA 56 (L) 10/18/2020   GFRAA >60 10/25/2019    Lab Results  Component Value Date   WBC 5.0 10/18/2020   NEUTROABS 2.2 10/18/2020   HGB 12.8 (L) 10/18/2020   HCT 37.0 (L) 10/18/2020   MCV 88.1 10/18/2020   PLT 296 10/18/2020   Lab Results  Component Value Date   IRON 72 10/18/2020   TIBC 470 (H) 10/18/2020   IRONPCTSAT 15 (L) 10/18/2020   Lab Results  Component Value Date   FERRITIN 12 (L) 10/18/2020   Lab Results  Component Value Date   TOTALPROTELP 7.0 10/18/2020   ALBUMINELP 4.2 10/18/2020   A1GS 0.2 10/18/2020   A2GS 0.9 10/18/2020   BETS 1.0 10/18/2020   GAMS 0.8  10/18/2020   MSPIKE 0.2 (H) 10/18/2020   SPEI Comment 10/18/2020     STUDIES: No results found.  ASSESSMENT: Iron deficiency anemia, MGUS  PLAN:    1. Iron deficiency anemia: Hemoglobin 12.8. Ferritin low at 12, iron saturation 15% with elevated TIBC 470.  He has a history of ulcerative colitis and I suspect that this may be contributing underlying etiology though he is asymptomatic currently and denies recent flares.  Advised him to reach out to his  GI doctor to determine if he should have additional work-up. Patient was previously intolerant to oral iron but cannot remember side effects.  Previously received IV Venofer but had allergic reaction that required evaluation in the ER in December 2015.  Additional doses of Venofer were canceled.  Given his history and reluctant to restart IV iron.  Would like him to retry oral preparations to assess tolerance. Patient agreeable.  We will plan to recheck his CBC, ferritin, and iron studies in 3 months and I will have him see Dr. Grayland Ormond to assess him a few days later. If he is intolerant to oral iron he can be seen back sooner and we can consider a different iron preparation.  2. MGUS- chronic and unchanged. M spike is stable at 0.2. Mildly elevated kappa free light chains at 25.9 but immunoglobulins are within normal limits. No evidence of endorgan damage. Low risk for progressing to multiple myeloma. No indication for bone marrow biopsy at this time.   3. 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  This is only seen in 11% of lymphocytes, previously 10% and year prior, 9%.  His total white blood cell count remains normal with normal differential.  Clinically, he is asymptomatic.  No further intervention needed at this time.  Plan to  repeat flow cytometry in 1 year.  I discussed the assessment and treatment plan with the patient. The patient was provided an opportunity to ask questions and all were answered. The patient agreed with the plan and demonstrated an understanding of the instructions.   The patient was advised to call back or seek an in-person evaluation if the symptoms worsen or if the condition fails to improve as anticipated.   I spent 35 minutes face-to-face visit time dedicated to the care of this patient on the date of this encounter to including pre-visit review of medical oncology notes, labs, discussing with supervising MD, face-to-face time with the patient, and post visit ordering of testing/documentation.    Beckey Rutter, DNP, AGNP-C [facility]: [facility]ie [facility], [facility]

## 2020-10-25 NOTE — Progress Notes (Signed)
MGUS follow up. Appetite fine. Energy is low. Regular BM. Voiding normal. Sleeping very well.

## 2021-01-25 ENCOUNTER — Other Ambulatory Visit: Payer: Self-pay | Admitting: Oncology

## 2021-01-28 ENCOUNTER — Other Ambulatory Visit: Payer: Self-pay

## 2021-01-28 ENCOUNTER — Inpatient Hospital Stay: Payer: Medicare Other | Attending: Oncology

## 2021-01-28 DIAGNOSIS — D509 Iron deficiency anemia, unspecified: Secondary | ICD-10-CM

## 2021-01-28 DIAGNOSIS — Z809 Family history of malignant neoplasm, unspecified: Secondary | ICD-10-CM | POA: Diagnosis not present

## 2021-01-28 DIAGNOSIS — Z87891 Personal history of nicotine dependence: Secondary | ICD-10-CM | POA: Diagnosis not present

## 2021-01-28 DIAGNOSIS — C9110 Chronic lymphocytic leukemia of B-cell type not having achieved remission: Secondary | ICD-10-CM | POA: Diagnosis not present

## 2021-01-28 DIAGNOSIS — D472 Monoclonal gammopathy: Secondary | ICD-10-CM | POA: Diagnosis not present

## 2021-01-28 LAB — CBC WITH DIFFERENTIAL/PLATELET
Abs Immature Granulocytes: 0.01 K/uL (ref 0.00–0.07)
Basophils Absolute: 0 K/uL (ref 0.0–0.1)
Basophils Relative: 1 %
Eosinophils Absolute: 0 K/uL (ref 0.0–0.5)
Eosinophils Relative: 0 %
HCT: 36 % — ABNORMAL LOW (ref 39.0–52.0)
Hemoglobin: 12.4 g/dL — ABNORMAL LOW (ref 13.0–17.0)
Immature Granulocytes: 0 %
Lymphocytes Relative: 46 %
Lymphs Abs: 2.1 K/uL (ref 0.7–4.0)
MCH: 32.2 pg (ref 26.0–34.0)
MCHC: 34.4 g/dL (ref 30.0–36.0)
MCV: 93.5 fL (ref 80.0–100.0)
Monocytes Absolute: 0.6 K/uL (ref 0.1–1.0)
Monocytes Relative: 14 %
Neutro Abs: 1.8 K/uL (ref 1.7–7.7)
Neutrophils Relative %: 39 %
Platelets: 264 K/uL (ref 150–400)
RBC: 3.85 MIL/uL — ABNORMAL LOW (ref 4.22–5.81)
RDW: 13.2 % (ref 11.5–15.5)
WBC: 4.5 K/uL (ref 4.0–10.5)
nRBC: 0 % (ref 0.0–0.2)

## 2021-01-28 LAB — FERRITIN: Ferritin: 18 ng/mL — ABNORMAL LOW (ref 24–336)

## 2021-01-28 LAB — IRON AND TIBC
Iron: 88 ug/dL (ref 45–182)
Saturation Ratios: 22 % (ref 17.9–39.5)
TIBC: 409 ug/dL (ref 250–450)
UIBC: 321 ug/dL

## 2021-01-28 NOTE — Progress Notes (Signed)
[place]  [phone]) [phone] Fax:(336) [phone]  ID: Kurt Miller OB: 11-18-1940  MR#: [id number]  EOF#:[id number]  Patient Care Team: Baxter Hire, MD as PCP - General (Internal Medicine) Lloyd Huger, MD as Consulting Physician (Hematology and Oncology)  CHIEF COMPLAINT: Iron deficiency anemia, MGUS  INTERVAL HISTORY: Patient returns to clinic today for repeat laboratory work and further evaluation.  He continues to feel well and remains asymptomatic.  Much of the history is given by his wife. He does not complain of weakness or fatigue. He has no neurologic complaints. He denies any recent fevers or illnesses. He has a good appetite and denies weight loss.  He denies any chest pain, shortness of breath, cough, or hemoptysis.  He denies any nausea, vomiting, constipation, or diarrhea. He has no urinary complaints.  Patient offers no specific complaints today.  REVIEW OF SYSTEMS:   Review of Systems  Constitutional: Negative.  Negative for fever, malaise/fatigue and weight loss.  Respiratory: Negative.  Negative for cough and shortness of breath.   Cardiovascular: Negative.  Negative for chest pain and leg swelling.  Gastrointestinal: Negative.  Negative for abdominal pain.  Genitourinary: Negative.  Negative for dysuria.  Musculoskeletal: Negative.  Negative for back pain.  Skin: Negative.  Negative for rash.  Neurological: Negative.  Negative for sensory change, focal weakness, weakness and headaches.  Psychiatric/Behavioral:  Positive for memory loss. The patient is not nervous/anxious.    As per HPI. Otherwise, a complete review of systems is negative.  PAST MEDICAL HISTORY: Past Medical History:  Diagnosis Date   Anemia    Anxiety    Cancer ([place])    Kidney   Chronic kidney disease    Depression    Hypertension     PAST SURGICAL HISTORY: Past Surgical History:  Procedure Laterality Date   KIDNEY SURGERY     NOSE SURGERY      TONSILLECTOMY      FAMILY HISTORY: Family History  Problem Relation Age of Onset   Cancer Mother    Diabetes Mother    Heart attack Father    Hypertension Brother    Stroke Brother    ADD / ADHD Brother    Hypertension Brother     ADVANCED DIRECTIVES (Y/N):  N  HEALTH MAINTENANCE: Social History   Tobacco Use   Smoking status: Former    Packs/day: 0.25    Years: 10.00    Pack years: 2.50    Types: Cigarettes    Quit date: 05/12/1984    Years since quitting: 36.7   Smokeless tobacco: Never  Vaping Use   Vaping Use: Never used  Substance Use Topics   Alcohol use: No   Drug use: No     Colonoscopy:  PAP:  Bone density:  Lipid panel:  Allergies  Allergen Reactions   Acetaminophen Other (See Comments)    Pancreatitis   Famotidine Other (See Comments)    Pancreatitis   Venofer [Iron Sucrose] Swelling    Current Outpatient Medications  Medication Sig Dispense Refill   amLODipine (NORVASC) 10 MG tablet Take 10 mg by mouth daily.      Calcium Polycarbophil (FIBER-CAPS PO) Take 2 capsules by mouth daily.     Cholecalciferol (VITAMIN D3) 2000 units TABS Take 1 tablet by mouth daily.     finasteride (PROSCAR) 5 MG tablet TAKE 1 TABLET DAILY     hydrochlorothiazide (HYDRODIURIL) 12.5 MG tablet Take 1 tablet by mouth daily.     lisinopril (PRINIVIL,ZESTRIL) 40  MG tablet TAKE 1 TABLET EVERY MORNING FOR BLOOD PRESSURE     Magnesium Oxide 500 MG (LAX) TABS Take 1 tablet by mouth daily.     Melatonin 5 MG CAPS Take 1 capsule by mouth at bedtime as needed.     Mesalamine 800 MG TBEC TAKE 2 TABLETS TWICE A DAY     Multiple Vitamins-Minerals (SENIOR MULTIVITAMIN PLUS PO) Take by mouth.     Omega-3 Fatty Acids (FISH OIL) 1000 MG CAPS Take by mouth.     omeprazole (PRILOSEC) 40 MG capsule TAKE 1 CAPSULE DAILY     potassium chloride SA (KLOR-CON) 20 MEQ tablet Take 1 tablet (20 mEq total) by mouth 2 (two) times daily. 1 pill twice a day 60 tablet 0   pravastatin (PRAVACHOL) 20  MG tablet TAKE 1 TABLET DAILY     QUEtiapine (SEROQUEL) 400 MG tablet Take 1 tablet (400 mg total) by mouth at bedtime. 90 tablet 1   sodium bicarbonate 650 MG tablet 650 mg 2 (two) times daily.     tamsulosin (FLOMAX) 0.4 MG CAPS capsule TAKE 1 CAPSULE DAILY     vitamin E 1000 UNIT capsule Take 1,000 Units by mouth daily.     No current facility-administered medications for this visit.    OBJECTIVE: Vitals:   01/29/21 1313  BP: 130/85  Pulse: 75  Resp: 16  Temp: 97.9 F (36.6 C)  SpO2: 100%     There is no height or weight on file to calculate BMI.    ECOG FS:0 - Asymptomatic  General: Well-developed, well-nourished, no acute distress. Eyes: Pink conjunctiva, anicteric sclera. HEENT: Normocephalic, moist mucous membranes. Lungs: No audible wheezing or coughing. Heart: Regular rate and rhythm. Abdomen: Soft, nontender, no obvious distention. Musculoskeletal: No edema, cyanosis, or clubbing. Neuro: Alert, answering all questions appropriately. Cranial nerves grossly intact. Skin: No rashes or petechiae noted. Psych: Normal affect.   LAB RESULTS:  Lab Results  Component Value Date   NA 138 10/18/2020   K 2.7 (LL) 10/18/2020   CL 102 10/18/2020   CO2 23 10/18/2020   GLUCOSE 137 (H) 10/18/2020   BUN 10 10/18/2020   CREATININE 1.29 (H) 10/18/2020   CALCIUM 8.0 (L) 10/18/2020   PROT 7.4 01/20/2018   ALBUMIN 4.1 01/20/2018   AST 37 01/20/2018   ALT 31 01/20/2018   ALKPHOS 71 01/20/2018   BILITOT 1.5 (H) 01/20/2018   GFRNONAA 56 (L) 10/18/2020   GFRAA >60 10/25/2019    Lab Results  Component Value Date   WBC 4.5 01/28/2021   NEUTROABS 1.8 01/28/2021   HGB 12.4 (L) 01/28/2021   HCT 36.0 (L) 01/28/2021   MCV 93.5 01/28/2021   PLT 264 01/28/2021   Lab Results  Component Value Date   IRON 88 01/28/2021   TIBC 409 01/28/2021   IRONPCTSAT 22 01/28/2021   Lab Results  Component Value Date   FERRITIN 18 (L) 01/28/2021   Lab Results  Component Value Date    TOTALPROTELP 7.0 10/18/2020   ALBUMINELP 4.2 10/18/2020   A1GS 0.2 10/18/2020   A2GS 0.9 10/18/2020   BETS 1.0 10/18/2020   GAMS 0.8 10/18/2020   MSPIKE 0.2 (H) 10/18/2020   SPEI Comment 10/18/2020     STUDIES: No results found.  ASSESSMENT: Iron deficiency anemia, MGUS  PLAN:    1. Iron deficiency anemia: Resolved.  Patient's hemoglobin continues to be essentially within normal limits at 12.4.  He has a mildly decreased ferritin, but this is trended up since he initiated  oral iron supplementation.  No intervention is needed at this time.  Patient previously had a reaction to Venofer.  Continue yearly follow-up.    2. MGUS: Chronic and unchanged.  Patient's most recent M spike continues to be stable at 0.2.  Immunoglobulins are within normal limits.  He has a mildly elevated kappa free light chain at 25.9.  He has no evidence of endorgan damage.  No intervention is needed at this time.  He does not require bone marrow biopsy.  Return to clinic in 1 year as above.   3.  Atypical CLL: Previously, patient was noted to have an abnormal flow cytometry with CD5+, CD23+, FMC7+, this cell population is classified as an atypical CLL phenotype.  FISH panel positive for trisomy 11 which is an intermediate prognosis for CLL.  No further intervention is needed.  Continue yearly follow-up as above.   Patient expressed understanding and was in agreement with this plan. He also understands that He can call clinic at any time with any questions, concerns, or complaints.    Lloyd Huger, MD   01/29/2021 6:29 PM

## 2021-01-29 ENCOUNTER — Inpatient Hospital Stay (HOSPITAL_BASED_OUTPATIENT_CLINIC_OR_DEPARTMENT_OTHER): Payer: Medicare Other | Admitting: Oncology

## 2021-01-29 ENCOUNTER — Other Ambulatory Visit: Payer: Commercial Managed Care - PPO

## 2021-01-29 ENCOUNTER — Inpatient Hospital Stay: Payer: Medicare Other

## 2021-01-29 VITALS — BP 130/85 | HR 75 | Temp 97.9°F | Resp 16

## 2021-01-29 DIAGNOSIS — D509 Iron deficiency anemia, unspecified: Secondary | ICD-10-CM | POA: Diagnosis not present

## 2021-01-29 DIAGNOSIS — D472 Monoclonal gammopathy: Secondary | ICD-10-CM

## 2021-01-29 NOTE — Progress Notes (Signed)
Pt c/o right sided intermittent pain in back under shoulder blade x 3 weeks.Pt wife has concerns about pt getting colonscopy at the end of the month as scheduled. No other concerns at this time

## 2021-02-11 ENCOUNTER — Encounter: Admission: RE | Payer: Self-pay | Source: Home / Self Care

## 2021-02-11 ENCOUNTER — Ambulatory Visit: Admission: RE | Admit: 2021-02-11 | Payer: Medicare Other | Source: Home / Self Care

## 2021-02-11 SURGERY — COLONOSCOPY
Anesthesia: General

## 2021-03-14 ENCOUNTER — Other Ambulatory Visit (HOSPITAL_COMMUNITY): Payer: Self-pay | Admitting: Neurology

## 2021-03-14 DIAGNOSIS — R413 Other amnesia: Secondary | ICD-10-CM

## 2021-03-19 ENCOUNTER — Telehealth: Payer: Self-pay | Admitting: *Deleted

## 2021-03-19 NOTE — Telephone Encounter (Signed)
Thereasa Distance, PA with [facility] asking to speak with Dr Grayland Ormond regarding this patient and whether it is safe to start him on Eliquis or not.  Please return her call, her cell and office number atr at the bottom of this note

## 2021-03-21 ENCOUNTER — Encounter: Payer: Self-pay | Admitting: Oncology

## 2021-03-22 ENCOUNTER — Other Ambulatory Visit: Payer: Self-pay

## 2021-03-22 ENCOUNTER — Ambulatory Visit (HOSPITAL_COMMUNITY)
Admission: RE | Admit: 2021-03-22 | Discharge: 2021-03-22 | Disposition: A | Discharge status: Home / Self Care | Payer: Medicare Other | Source: Ambulatory Visit | Attending: Neurology | Admitting: Neurology

## 2021-03-22 DIAGNOSIS — R413 Other amnesia: Secondary | ICD-10-CM | POA: Insufficient documentation

## 2021-06-06 ENCOUNTER — Telehealth: Payer: Self-pay

## 2021-06-06 NOTE — Telephone Encounter (Signed)
Spoke with patient's wife Kurt Miller and scheduled a Mychart Palliative Consult for 06/11/21 @ 2:30 PM.   Consent obtained; updated Outlook/Netsmart/Team List and Epic.

## 2021-06-11 ENCOUNTER — Encounter: Payer: Self-pay | Admitting: Oncology

## 2021-06-11 ENCOUNTER — Encounter: Payer: Self-pay | Admitting: Nurse Practitioner

## 2021-06-11 ENCOUNTER — Telehealth: Payer: Medicare Other | Admitting: Nurse Practitioner

## 2021-06-11 ENCOUNTER — Other Ambulatory Visit: Payer: Self-pay

## 2021-06-11 DIAGNOSIS — R4189 Other symptoms and signs involving cognitive functions and awareness: Secondary | ICD-10-CM

## 2021-06-11 DIAGNOSIS — Z515 Encounter for palliative care: Secondary | ICD-10-CM

## 2021-06-11 NOTE — Progress Notes (Addendum)
[facility] Consult Note Telephone: [phone]  Fax: [phone]   Date of encounter: 06/11/21 5:14 PM PATIENT NAME: Kurt Miller [address] Kurt Miller Kurt Miller [address]   [phone] (home)  DOB: 08-23-1940 MRN: [id number] PRIMARY CARE PROVIDER:    Baxter Hire, MD,  [place] Kurt Miller [address] [phone]  REFERRING PROVIDER:   Baxter Hire, MD Kurt Miller,  [place] [address] [phone]  RESPONSIBLE PARTY:    Contact Information     Name Relation Home Work [place] Spouse [phone]     Wessley, Emert   [phone]      Due to the COVID-19 crisis, this visit was done via telemedicine from my office and it was initiated and consent by this patient and or family.  I connected with  Kurt Miller Kurt Miller with Kurt Miller OR [facility] on 06/11/21 by a video enabled telemedicine application and verified that I am speaking with the correct person.   I discussed the limitations of evaluation and management by telemedicine. The patient expressed understanding and agreed to proceed.  Palliative Care was asked to follow this patient by consultation request of  Kurt Hire, MD to address advance care planning and complex medical decision making. This is the initial visit.                               ASSESSMENT AND PLAN / RECOMMENDATIONS:  Advance Care Planning/Goals of Care: Goals include to maximize quality of life and symptom management. Patient/health care surrogate gave his/her permission to discuss.Our advance care planning conversation included a discussion about:    The value and importance of advance care planning  Experiences with loved ones who have been seriously ill or have died  Exploration of personal, cultural or spiritual beliefs that might influence medical decisions  Exploration of goals of care in the event of a sudden injury or illness   Identification  of a healthcare agent  Review and updating or creation of an  advance directive document . Decision not to resuscitate or to de-escalate disease focused treatments due to poor prognosis. CODE STATUS: Full code  Symptom Management/Plan: 1. Advance Care Planning;  Full code, ongoing discussions with Kurt Miller Kurt Miller with son Kurt Miller, [place], of scenerios, reviewed MOST form which they have a copy in the home, will revisit at next pc visit or sooner if decided to complete  2. Goals of Care: Goals include to maximize quality of life and symptom management. Our advance care planning conversation included a discussion about:    The value and importance of advance care planning  Exploration of personal, cultural or spiritual beliefs that might influence medical decisions  Exploration of goals of care in the event of a sudden injury or illness  Identification and preparation of a healthcare agent  Review and updating or creation of an advance directive document.  3. 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 Discussed chronic disease progression with expectations.  05/30/2021 Weight 214 lbs BMI 30.71 4. Palliative care encounter; Palliative care encounter; Palliative medicine team will continue to support patient, patient's family, and medical team. Visit consisted of counseling and education dealing with the complex and emotionally intense issues of symptom management and palliative care in the setting of serious and potentially life-threatening illness  Follow up Palliative Care Visit: Palliative care will  continue to follow for complex medical decision making, advance care planning, and clarification of goals. Return 8 weeks or prn.  I spent 62 minutes providing this consultation. More than 50% of the time in this consultation was spent in counseling and care coordination.  PPS: 50%  HOSPICE  ELIGIBILITY/DIAGNOSIS: TBD  Chief Complaint: Initial palliative consult for complex medical decision making  HISTORY OF PRESENT ILLNESS:  Kurt Miller is a 81 y.Kurt Miller. 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 I did a telemedicine video visit with Kurt Miller and Kurt Miller Kurt Miller with son Kurt Miller. Kurt Miller Kurt Miller and Kurt Miller are HCPOA. We talked about past medical history, last time Kurt Miller. Kurt Miller was independent, life review, family dynamics, ros, symptoms, sleep patterns, behaviors. Discussed medications, last visit with Dr Kurt Miller, Neurologist. We talked about functional and cognitive decline, hcpoa, medical goals, code status at length. We talked about what DNR vs full code sceneries, discussed most form. We talked about Kurt Miller. Kurt Miller daily routine. We talked about quality of life. We talked about option of PACE program. Will have PC SW contact with further information about PACE. Kurt Miller Kurt Miller in agreement. We talked about PC in role. Currently Kurt Miller. Kurt Miller appears stable. Will schedule f/u visit, and Kurt Miller Kurt Miller in agreement. Therapeutic listening, emotional support provided. Kurt Miller Kurt Miller was interactive, smiling through pc visit. Questions answered. Discussed should Kurt Miller Kurt Miller decide to complete  MOST form sooner to contact and can schedule an earlier appointment. Kurt Miller Kurt Miller in agreement.    History obtained from review of EMR, discussion with Kurt Miller and Kurt Miller. Kurt Miller. With son Kurt Miller I reviewed available labs, medications, imaging, studies and related documents from the EMR.  Records reviewed and summarized above.   ROS 10 point system reviewed with Kurt Miller, Kurt Miller Kurt Miller and son Kurt Miller, all negative except HPI  Physical Exam: deferred CURRENT PROBLEM LIST:  Patient Active Problem List   Diagnosis Date Noted   Sepsis ([place]) 01/20/2018   MGUS (monoclonal gammopathy of unknown  significance) 10/10/2015   Abdominal pain 10/11/2014   Acute inflammation of the pancreas 10/11/2014   Blood in feces 10/11/2014   Calculus of gallbladder 10/11/2014   Cervical pain 10/11/2014   Narrowing of intervertebral disc space 10/11/2014   Disorder of function of stomach 10/11/2014   Accumulation of fluid in tissues 10/11/2014   Anxiety, generalized 10/11/2014   History of digestive disease 10/11/2014   LBP (low back pain) 10/11/2014   Amnesia 10/11/2014   Headache, migraine 10/11/2014   Excessive urination at night 10/11/2014   Arthralgia of hip or thigh 10/11/2014   Apnea, sleep 10/11/2014   CN (constipation) 10/11/2014   Obstructive apnea 08/24/2014   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   H/Kurt Miller urinary disorder 01/10/2013   Impaired renal function 01/10/2013   Urgency of micturation 05/10/2012   Hypercholesteremia 08/18/2011   Hernia, incisional 02/13/2011   Affective bipolar disorder ([place]) 11/08/2010   Cough 11/08/2010   Dry mouth 11/08/2010   Benign essential HTN 11/08/2010   PND (post-nasal drip) 11/08/2010   PAST MEDICAL HISTORY:  Active Ambulatory Problems    Diagnosis Date Noted   Abdominal pain 10/11/2014  Acute inflammation of the pancreas 10/11/2014   Iron deficiency anemia 07/04/2013   Bipolar 1 disorder ([place]) 07/04/2013   Affective bipolar disorder ([place]) 11/08/2010   Blood in feces 10/11/2014   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   Calculus of gallbladder 10/11/2014   Cancer of kidney ([place]) 07/04/2013   Cervical pain 10/11/2014   Chronic ulcerative colitis ([place]) 01/05/2014   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   Cough 11/08/2010   Narrowing of intervertebral  disc space 10/11/2014   AD (Alzheimer's disease) ([place]) 05/24/2014   Clinical depression 02/21/2014   Dry mouth 11/08/2010   Disorder of function of stomach 10/11/2014   Accumulation of fluid in tissues 10/11/2014   Benign essential HTN 11/08/2010   Anxiety, generalized 10/11/2014   Hernia, incisional 02/13/2011   H/Kurt Miller urinary disorder 01/10/2013   History of digestive disease 10/11/2014   Hypercholesteremia 08/18/2011   LBP (low back pain) 10/11/2014   Amnesia 10/11/2014   Headache, migraine 10/11/2014   Excessive urination at night 10/11/2014   Obstructive apnea 08/24/2014   Arthralgia of hip or thigh 10/11/2014   PND (post-nasal drip) 11/08/2010   Impaired renal function 01/10/2013   Apnea, sleep 10/11/2014   CN (constipation) 10/11/2014   Urgency of micturation 05/10/2012   MGUS (monoclonal gammopathy of unknown significance) 10/10/2015   Sepsis ([place]) 01/20/2018   Resolved Ambulatory Problems    Diagnosis Date Noted   No Resolved Ambulatory Problems   Past Medical History:  Diagnosis Date   Anemia    Anxiety    Cancer ([place])    Chronic kidney disease    Depression    Hypertension    SOCIAL HX:  Social History   Tobacco Use   Smoking status: Former    Packs/day: 0.25    Years: 10.00    Pack years: 2.50    Types: Cigarettes    Quit date: 05/12/1984    Years since quitting: 37.1   Smokeless tobacco: Never  Substance Use Topics   Alcohol use: No   FAMILY HX:  Family History  Problem Relation Age of Onset   Cancer Mother    Diabetes Mother    Heart attack Father    Hypertension Brother    Stroke Brother    ADD / ADHD Brother    Hypertension Brother       ALLERGIES:  Allergies  Allergen Reactions   Acetaminophen Other (See Comments)    Pancreatitis   Famotidine Other (See Comments)    Pancreatitis   Venofer [Iron Sucrose] Swelling     PERTINENT MEDICATIONS:  Outpatient Encounter Medications as of 06/11/2021  Medication Sig   amLODipine (NORVASC)  10 MG tablet Take 10 mg by mouth daily.    Calcium Polycarbophil (FIBER-CAPS PO) Take 2 capsules by mouth daily.   Cholecalciferol (VITAMIN D3) 2000 units TABS Take 1 tablet by mouth daily.   finasteride (PROSCAR) 5 MG tablet TAKE 1 TABLET DAILY   hydrochlorothiazide (HYDRODIURIL) 12.5 MG tablet Take 1 tablet by mouth daily.   lisinopril (PRINIVIL,ZESTRIL) 40 MG tablet TAKE 1 TABLET EVERY MORNING FOR BLOOD PRESSURE   Magnesium Oxide 500 MG (LAX) TABS Take 1 tablet by mouth daily.   Melatonin 5 MG CAPS Take 1 capsule by mouth at bedtime as needed.   Mesalamine 800 MG TBEC TAKE 2 TABLETS TWICE A DAY   Multiple Vitamins-Minerals (SENIOR MULTIVITAMIN PLUS PO) Take by mouth.   Omega-3 Fatty Acids (FISH OIL) 1000 MG CAPS Take by mouth.   omeprazole (PRILOSEC) 40 MG capsule  TAKE 1 CAPSULE DAILY   potassium chloride SA (KLOR-CON) 20 MEQ tablet Take 1 tablet (20 mEq total) by mouth 2 (two) times daily. 1 pill twice a day   pravastatin (PRAVACHOL) 20 MG tablet TAKE 1 TABLET DAILY   QUEtiapine (SEROQUEL) 400 MG tablet Take 1 tablet (400 mg total) by mouth at bedtime.   sodium bicarbonate 650 MG tablet 650 mg 2 (two) times daily.   tamsulosin (FLOMAX) 0.4 MG CAPS capsule TAKE 1 CAPSULE DAILY   vitamin E 1000 UNIT capsule Take 1,000 Units by mouth daily.   No facility-administered encounter medications on file as of 06/11/2021.   Thank you for the opportunity to participate in the care of Kurt Miller. Forget.  The palliative care team will continue to follow. Please call our office at [phone] if we can be of additional assistance.   Nghia Mcentee Z Minas Bonser, NP ,

## 2021-06-20 DIAGNOSIS — Z515 Encounter for palliative care: Secondary | ICD-10-CM

## 2021-06-20 NOTE — Progress Notes (Signed)
PC SW outreached patients spouse, Kurt Miller, per PC NPMidge Aver SW referral request to assess patient needs. ? ?Spouse shared that she was interested in adult day programs for patient as she would like for him to obtain more mental stimulation. Spouse share that currently their day-to-day routine is wake in the AM have a breakfast, nap, watch TV or do puzzles, have lunch, possibly nap again and get ready for dinner and bed. Spouse share that patient is more alert in early afternoon. Patient is ambulatory w/o AD but has them if needed. Patient is independent with toileting and hygiene. ? ?Spouse and SW discussed local adult day programs as they are [place] residents but live closer to [employer]. Spouse shared that she and patient had toured [facility] adult day program a little over a year ago, and at the time patient was not to pleased, however spouse is open to re-visiting the option. Spouse has also looked into the [place] day program at [facility], however they require COVID vaccine of which patient and spouse do not have. SW Discussed [facility] program as well as day programs in [place] to include [place]. Spouse is open to receiving more information on these programs. SW will mail information to address on file. ? ?Questions and concerns were addressed. The spouse was encouraged to call with any additional questions and/or concerns. PC Provided general support and encouragement, no other unmet needs identified. ?

## 2021-08-08 ENCOUNTER — Other Ambulatory Visit: Payer: Medicare Other | Admitting: Nurse Practitioner

## 2021-08-08 ENCOUNTER — Encounter: Payer: Self-pay | Admitting: Nurse Practitioner

## 2021-08-08 DIAGNOSIS — R4189 Other symptoms and signs involving cognitive functions and awareness: Secondary | ICD-10-CM

## 2021-08-08 DIAGNOSIS — Z515 Encounter for palliative care: Secondary | ICD-10-CM

## 2021-08-08 NOTE — Progress Notes (Signed)
? ? ?Manufacturing engineer ?Community Palliative Care Consult Note ?Telephone: [phone]  ?Fax: [phone]  ? ? ?Date of encounter: 08/08/21 ?2:19 PM ?PATIENT NAME: Arvid Right ?[place] [address]   ?[phone] (home)  ?DOB: December 07, 1940 ?MRN: [id number] ?PRIMARY CARE PROVIDER:    ?Baxter Hire, MD,  ?[address] ?[place] [address] ?[phone] ? ?RESPONSIBLE PARTY:    ?Contact Information   ? ? Name Relation Home Work Mobile  ? Keahey,Linda Spouse [phone]    ? Backstrom,Rocky Son   [phone]  ? ?  ? ?I met face to face with patient and family in home. Palliative Care was asked to follow this patient by consultation request of  Baxter Hire, MD to address advance care planning and complex medical decision making. This is a follow up visit.                                  ?ASSESSMENT AND PLAN / RECOMMENDATIONS:  ?Advance Care Planning/Goals of Care: Goals include to maximize quality of life and symptom management. Patient/health care surrogate gave his/her permission to discuss. ?Our advance care planning conversation included a discussion about:    ?The value and importance of advance care planning  ?Experiences with loved ones who have been seriously ill or have died  ?Exploration of personal, cultural or spiritual beliefs that might influence medical decisions  ?Exploration of goals of care in the event of a sudden injury or illness  ?Identification of a healthcare agent  ?Review and updating or creation of an  advance directive document . ?Decision not to resuscitate or to de-escalate disease focused treatments due to poor prognosis. ?CODE STATUS: Full code ? ?Symptom Management/Plan: ?1. Advance Care Planning;  Full code, ongoing discussions with Mrs Ringenberg with son [name], [place], of scenerios, re-visited MOST form, left a blank copy in the home for further discussion. Most PC visit ACP discussions. ?  ?2. Goals of Care: Goals include to  maximize quality of life and symptom management. Our advance care planning conversation included a discussion about:    ?The value and importance of advance care planning  ?Exploration of personal, cultural or spiritual beliefs that might influence medical decisions  ?Exploration of goals of care in the event of a sudden injury or illness  ?Identification and preparation of a healthcare agent  ?Review and updating or creation of an advance directive document. ?  ?3. 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 Discussed chronic disease progression with expectations. We talked about long term planning, making adjustments to their home should it get to the point where Mr. Holt is no longer mobile, ambulatory. Discussed future planning for wishes for how it would look like at that time, also should something happen to Mrs Lenderman the plan for Mr. Kanaan.  We talked about social worker [place] PC discussion with Mrs Cangelosi about daycare's, pace program. Mrs Vanderhoof endorses she has explored [facility] program though would have to get covid vaccine. We talked about daily routine, ros, symptoms, support system. Mrs Ulbrich endorses Mr Fristoe mostly sits during the day, sometimes works on puzzles though has been declining in that activity, does not seem as interested. We talked about further family discussions on how that would look in the future. Support provided.  ? ?4. Palliative care encounter; Palliative care encounter; Palliative medicine team will continue to support patient,  patient's family, and medical team. Visit consisted of counseling and education dealing with the complex and emotionally intense issues of symptom management and palliative care in the setting of serious and potentially life-threatening illness ? ?Follow up Palliative Care Visit: Palliative care will continue to follow for complex medical decision making, advance care  planning, and clarification of goals. Return 12 weeks or prn. ? ?I spent 66 minutes providing this consultation. More than 50% of the time in this consultation was spent in counseling and care coordination. ?PPS: 50% ? ?Chief Complaint: Follow up palliative consult for complex medical decision making ? ?HISTORY OF PRESENT ILLNESS:  JAWAAN ADACHI is a 81 y.o. 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 I called Mrs Hitchens to confirm in person f/u pc visit, covid screening negative. I met with Rocky, Mr Kuhner son, Mrs and Mr Brisby. Mr Meddings was sitting in the living room, very cooperative, smiling, interactive. Appears comfortable. Forgetful when asked questions such as what did he have to eat today. Did not remember what he retired from, often looked to Mrs Lemme for answers.  ? ?History obtained from review of EMR, discussion with interview with family, wife and son [name] with  Mr. Iten.  ?I reviewed available labs, medications, imaging, studies and related documents from the EMR.  Records reviewed and summarized above.  ? ?ROS ?10 point system reviewed all negative except HPI ? ?Physical Exam: ?Constitutional: NAD ?General: tall, pleasantly confused male ?EYES: lids intact ?ENMT: oral mucous membranes moist ?CV: S1S2, RRR, no LE edema ?Pulmonary: LCTA, no increased work of breathing, no cough, room air ?Abdomen: intake 100%, soft and non tender ?MSK: ambulatory ?Skin: warm and dry ?Neuro:  no generalized weakness,  + cognitive impairment ?Psych: non-anxious affect, A and Oriented to self ?Thank you for the opportunity to participate in the care of Mr. Merlo.  The palliative care team will continue to follow. Please call our office at [phone] if we can be of additional assistance.  ? ?Brant Peets Z Ahman Dugdale, NP  ? ?COVID-19 PATIENT SCREENING  TOOL ?Asked and negative response unless otherwise noted:  ? ?Have you had symptoms of covid, tested positive or been in contact with someone with symptoms/positive test in the past 5-10 days?no   ?

## 2021-11-26 ENCOUNTER — Encounter: Payer: Self-pay | Admitting: Nurse Practitioner

## 2021-11-26 ENCOUNTER — Other Ambulatory Visit: Payer: Medicare Other | Admitting: Nurse Practitioner

## 2021-11-26 DIAGNOSIS — Z515 Encounter for palliative care: Secondary | ICD-10-CM

## 2021-11-26 DIAGNOSIS — R5383 Other fatigue: Secondary | ICD-10-CM

## 2021-11-26 DIAGNOSIS — R4189 Other symptoms and signs involving cognitive functions and awareness: Secondary | ICD-10-CM

## 2021-11-26 DIAGNOSIS — R197 Diarrhea, unspecified: Secondary | ICD-10-CM

## 2021-11-26 DIAGNOSIS — F0280 Dementia in other diseases classified elsewhere without behavioral disturbance: Secondary | ICD-10-CM

## 2021-11-26 NOTE — Progress Notes (Signed)
[facility] Consult Note Telephone: [phone]  Fax: [phone]    Date of encounter: 11/26/21 12:56 PM PATIENT NAME: Kurt Miller [address] [place] [address]   [phone] (home)  DOB: [birth year] MRN: [id number] PRIMARY CARE PROVIDER:    Baxter Hire, MD,  [place] [address] [phone]  RESPONSIBLE PARTY:    Contact Information     Name Relation Home Work Mobile   Pellegrini,Linda Spouse [phone]     Emilio, [place]   [phone]       I met face to face with patient and family in home. Palliative Care was asked to follow this patient by consultation request of  Baxter Hire, MD to address advance care planning and complex medical decision making. This is a follow up visit.                                  ASSESSMENT AND PLAN / RECOMMENDATIONS:   Symptom Management/Plan: 1. Advance Care Planning;  Full code, ongoing discussions    2. 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 Discussed chronic disease progression with expectations. We talked about long term planning. Kurt Wahlert continues to go to Daycare at [facility] twice a week which seems to be working out well. Ms. Drew endorses does give her some time to get other things done. We talked about delusions which Kurt. Elwood seems to be experiencing toward the end of the afternoons. Discussed coping strategies. We talked about upcoming Neurology visit with Dr Manuella [name]. Talked about Dementia resources, LTC insurance policy.   3. Fatigue; Progressive atypical CLL, MGUS chronic with h/o IDA; ongoing fatigue, discussed sleeping patterns, he does sleep well throughout the night with nap during the day. Discussed to monitor patterns, allow rest, oral hydration with nutrition education.  4. Chronic diarrhea; discussed incontinence episodes. We talked about  using Lomotil as GI recommended with complete workup with ?etiology. Discussed nutrition/hydration   5. Palliative care encounter; Palliative care encounter; Palliative medicine team will continue to support patient, patient's family, and medical team. Visit consisted of counseling and education dealing with the complex and emotionally intense issues of symptom management and palliative care in the setting of serious and potentially life-threatening illness   Follow up Palliative Care Visit: Palliative care will continue to follow for complex medical decision making, advance care planning, and clarification of goals. Return 12 weeks or prn.   I spent 46 minutes providing this consultation. More than 50% of the time in this consultation was spent in counseling and care coordination. PPS: 50%   Chief Complaint: Follow up palliative consult for complex medical decision making   HISTORY OF PRESENT ILLNESS:  Kurt Miller is a 81 y.o. 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 I called Kurt Miller to confirm in person f/u pc visit, covid screening negative. I met with Kurt Miller, Kurt Schreur son, Kurt and Kurt Miller. Kurt Miller was sitting in the living room, very cooperative, smiling, interactive. Appears comfortable. Kurt Miller was cooperative, smiling though limited with answering questions. Kurt. Miller at times did ask a few simple questions. We talked about how he has been feeling, endorses intermit tired. We talked about daily activities, difficulty remembering even going to daycare.  We talked about nutrition, appetite, ros. We talked about behaviors, redirecting when ruminating on a topic. Examples discussed. We talked about edema to legs, hand limit RAS. We talked about routines, functional and cognitive changes. We talked about realistic expectations with  overall progression with dementia. Talked at length about Kurt Miller daycare, caregiver fatigue, stress, coping strategies. We talked about LTC insurance policy, Museum/gallery curator for supplies if needed. Reviewed goals of care. We talked about role pc in poc. Therapeutic listening, emotional support provided, questions answered, f/u pc visit scheduled.    History obtained from review of EMR, discussion with interview with family, wife and son [name] with  Kurt. Miller.  I reviewed available labs, medications, imaging, studies and related documents from the EMR.  Records reviewed and summarized above.    ROS 10 point system reviewed all negative except HPI   Physical Exam: Constitutional: NAD General: tall, pleasantly confused male EYES: lids intact ENMT: oral mucous membranes moist CV: S1S2, RRR, no LE edema Pulmonary: LCTA, no increased work of breathing, no cough, room air Abdomen: intake 100%, soft and non tender MSK: ambulatory Skin: warm and dry Neuro:  no generalized weakness,  + cognitive impairment Psych: non-anxious affect, A and Oriented to self  Thank you for the opportunity to participate in the care of Kurt. Miller.  The palliative care team will continue to follow. Please call our office at [phone] if we can be of additional assistance.   Durk Carmen Z Dariel Betzer, NP   COVID-19 PATIENT SCREENING TOOL Asked and negative response unless otherwise noted:   Have you had symptoms of covid, tested positive or been in contact with someone with symptoms/positive test in the past 5-10 days? NO

## 2022-01-20 ENCOUNTER — Other Ambulatory Visit: Payer: Commercial Managed Care - PPO

## 2022-01-27 ENCOUNTER — Ambulatory Visit: Payer: Commercial Managed Care - PPO | Admitting: Nurse Practitioner

## 2022-02-10 ENCOUNTER — Inpatient Hospital Stay: Payer: Medicare Other | Attending: Nurse Practitioner

## 2022-02-10 DIAGNOSIS — D472 Monoclonal gammopathy: Secondary | ICD-10-CM | POA: Insufficient documentation

## 2022-02-10 DIAGNOSIS — D509 Iron deficiency anemia, unspecified: Secondary | ICD-10-CM | POA: Insufficient documentation

## 2022-02-10 DIAGNOSIS — C9110 Chronic lymphocytic leukemia of B-cell type not having achieved remission: Secondary | ICD-10-CM

## 2022-02-10 LAB — CBC WITH DIFFERENTIAL/PLATELET
Abs Immature Granulocytes: 0.03 10*3/uL (ref 0.00–0.07)
Basophils Absolute: 0 10*3/uL (ref 0.0–0.1)
Basophils Relative: 0 %
Eosinophils Absolute: 0 10*3/uL (ref 0.0–0.5)
Eosinophils Relative: 0 %
HCT: 37.1 % — ABNORMAL LOW (ref 39.0–52.0)
Hemoglobin: 12.7 g/dL — ABNORMAL LOW (ref 13.0–17.0)
Immature Granulocytes: 1 %
Lymphocytes Relative: 33 %
Lymphs Abs: 2.1 10*3/uL (ref 0.7–4.0)
MCH: 32.2 pg (ref 26.0–34.0)
MCHC: 34.2 g/dL (ref 30.0–36.0)
MCV: 94.2 fL (ref 80.0–100.0)
Monocytes Absolute: 0.7 10*3/uL (ref 0.1–1.0)
Monocytes Relative: 11 %
Neutro Abs: 3.6 10*3/uL (ref 1.7–7.7)
Neutrophils Relative %: 55 %
Platelets: 262 10*3/uL (ref 150–400)
RBC: 3.94 MIL/uL — ABNORMAL LOW (ref 4.22–5.81)
RDW: 13 % (ref 11.5–15.5)
WBC: 6.5 10*3/uL (ref 4.0–10.5)
nRBC: 0 % (ref 0.0–0.2)

## 2022-02-10 LAB — FERRITIN: Ferritin: 30 ng/mL (ref 24–336)

## 2022-02-11 LAB — KAPPA/LAMBDA LIGHT CHAINS
Kappa free light chain: 40.1 mg/L — ABNORMAL HIGH (ref 3.3–19.4)
Kappa, lambda light chain ratio: 2.35 — ABNORMAL HIGH (ref 0.26–1.65)
Lambda free light chains: 17.1 mg/L (ref 5.7–26.3)

## 2022-02-12 LAB — COMP PANEL: LEUKEMIA/LYMPHOMA: Immunophenotypic Profile: 5

## 2022-02-12 LAB — IGG, IGA, IGM
IgA: 143 mg/dL (ref 61–437)
IgG (Immunoglobin G), Serum: 868 mg/dL (ref 603–1613)
IgM (Immunoglobulin M), Srm: 29 mg/dL (ref 15–143)

## 2022-02-17 ENCOUNTER — Inpatient Hospital Stay: Payer: Medicare Other

## 2022-02-17 ENCOUNTER — Inpatient Hospital Stay: Payer: Medicare Other | Admitting: Oncology

## 2022-02-17 ENCOUNTER — Encounter: Payer: Self-pay | Admitting: Medical Oncology

## 2022-02-17 ENCOUNTER — Inpatient Hospital Stay: Payer: Medicare Other | Attending: Nurse Practitioner | Admitting: Medical Oncology

## 2022-02-17 VITALS — BP 122/79 | HR 113 | Temp 97.6°F | Wt 221.0 lb

## 2022-02-17 DIAGNOSIS — C9110 Chronic lymphocytic leukemia of B-cell type not having achieved remission: Secondary | ICD-10-CM

## 2022-02-17 DIAGNOSIS — D472 Monoclonal gammopathy: Secondary | ICD-10-CM | POA: Insufficient documentation

## 2022-02-17 DIAGNOSIS — D509 Iron deficiency anemia, unspecified: Secondary | ICD-10-CM | POA: Diagnosis present

## 2022-02-17 DIAGNOSIS — N189 Chronic kidney disease, unspecified: Secondary | ICD-10-CM | POA: Insufficient documentation

## 2022-02-17 NOTE — Progress Notes (Signed)
[facility]  [phone] [phone]  ID: Kurt Miller OB: [birth year]  MR#: [id number]  [facility]#:[id number]  Patient Care Team: Baxter Hire, MD as PCP - General (Internal Medicine) Lloyd Huger, MD as Consulting Physician (Hematology and Oncology)  CHIEF COMPLAINT: Iron deficiency anemia, MGUS  INTERVAL HISTORY: Patient returns to clinic today for repeat laboratory work and further evaluation.  He continues to feel well and remains asymptomatic.  Much of the history is given by his wife. He is also joined virtually by his son. He does not complain of weakness or fatigue. He has no neurologic complaints. He denies any recent fevers or illnesses. He has a good appetite and denies weight loss.  He denies any chest pain, shortness of breath, cough, or hemoptysis.  He denies any nausea, vomiting, constipation, or diarrhea. He has no urinary complaints.  Patient offers no specific complaints today.  REVIEW OF SYSTEMS:   Review of Systems  Constitutional: Negative.  Negative for fever, malaise/fatigue and weight loss.  Respiratory: Negative.  Negative for cough and shortness of breath.   Cardiovascular: Negative.  Negative for chest pain and leg swelling.  Gastrointestinal: Negative.  Negative for abdominal pain.  Genitourinary: Negative.  Negative for dysuria.  Musculoskeletal: Negative.  Negative for back pain.  Skin: Negative.  Negative for rash.  Neurological: Negative.  Negative for sensory change, focal weakness, weakness and headaches.  Psychiatric/Behavioral:  Positive for memory loss. The patient is not nervous/anxious.    Wt Readings from Last 3 Encounters:  02/17/22 221 lb (100.2 kg)  11/01/19 207 lb 9.6 oz (94.2 kg)  01/20/18 230 lb (104.3 kg)    As per HPI. Otherwise, a complete review of systems is negative.  PAST MEDICAL HISTORY: Past Medical History:  Diagnosis Date   Anemia    Anxiety    Cancer ([place])    Kidney    Chronic kidney disease    Depression    Hypertension     PAST SURGICAL HISTORY: Past Surgical History:  Procedure Laterality Date   KIDNEY SURGERY     NOSE SURGERY     TONSILLECTOMY      FAMILY HISTORY: Family History  Problem Relation Age of Onset   Cancer Mother    Diabetes Mother    Heart attack Father    Hypertension Brother    Stroke Brother    ADD / ADHD Brother    Hypertension Brother     ADVANCED DIRECTIVES (Y/N):  N  HEALTH MAINTENANCE: Social History   Tobacco Use   Smoking status: Former    Packs/day: 0.25    Years: 10.00    Total pack years: 2.50    Types: Cigarettes    Quit date: 05/12/1984    Years since quitting: 37.7   Smokeless tobacco: Never  Vaping Use   Vaping Use: Never used  Substance Use Topics   Alcohol use: No   Drug use: No     Colonoscopy:  PAP:  Bone density:  Lipid panel:  Allergies  Allergen Reactions   Acetaminophen Other (See Comments)    Pancreatitis   Famotidine Other (See Comments)    Pancreatitis   Venofer [Iron Sucrose] Swelling    Current Outpatient Medications  Medication Sig Dispense Refill   amLODipine (NORVASC) 10 MG tablet Take 10 mg by mouth daily.      Calcium Polycarbophil (FIBER-CAPS PO) Take 2 capsules by mouth daily.     Cholecalciferol (VITAMIN D3) 2000 units TABS Take 1 tablet  by mouth daily.     finasteride (PROSCAR) 5 MG tablet TAKE 1 TABLET DAILY     hydrochlorothiazide (HYDRODIURIL) 12.5 MG tablet Take 1 tablet by mouth daily.     lisinopril (PRINIVIL,ZESTRIL) 40 MG tablet TAKE 1 TABLET EVERY MORNING FOR BLOOD PRESSURE     Magnesium Oxide 500 MG (LAX) TABS Take 1 tablet by mouth daily.     Melatonin 5 MG CAPS Take 1 capsule by mouth at bedtime as needed.     Mesalamine 800 MG TBEC TAKE 2 TABLETS TWICE A DAY     Multiple Vitamins-Minerals (SENIOR MULTIVITAMIN PLUS PO) Take by mouth.     Omega-3 Fatty Acids (FISH OIL) 1000 MG CAPS Take by mouth.     omeprazole (PRILOSEC) 40 MG capsule TAKE 1  CAPSULE DAILY     potassium chloride SA (KLOR-CON) 20 MEQ tablet Take 1 tablet (20 mEq total) by mouth 2 (two) times daily. 1 pill twice a day 60 tablet 0   pravastatin (PRAVACHOL) 20 MG tablet TAKE 1 TABLET DAILY     QUEtiapine (SEROQUEL) 400 MG tablet Take 1 tablet (400 mg total) by mouth at bedtime. 90 tablet 1   sodium bicarbonate 650 MG tablet 650 mg 2 (two) times daily.     tamsulosin (FLOMAX) 0.4 MG CAPS capsule TAKE 1 CAPSULE DAILY     tamsulosin (FLOMAX) 0.4 MG CAPS capsule Take 1 capsule by mouth daily.     vitamin E 1000 UNIT capsule Take 1,000 Units by mouth daily.     QUEtiapine (SEROQUEL) 25 MG tablet Take by mouth.     No current facility-administered medications for this visit.    OBJECTIVE: Vitals:   02/17/22 1429  BP: 122/79  Pulse: (!) 113  Temp: 97.6 F (36.4 C)  SpO2: 97%     Body mass index is 32.64 kg/m.    ECOG FS:0 - Asymptomatic  General: Well-developed, well-nourished, no acute distress. Eyes: Pink conjunctiva, anicteric sclera. HEENT: Normocephalic, moist mucous membranes. Lungs: No audible wheezing or coughing. Heart: Irregular Irregular rhythm  Abdomen: Soft, nontender, no obvious distention. Musculoskeletal: No edema, cyanosis, or clubbing. Neuro: Alert, answering all questions appropriately. Cranial nerves grossly intact. Skin: No rashes or petechiae noted. Psych: Normal affect.   LAB RESULTS:  Lab Results  Component Value Date   NA 138 10/18/2020   K 2.7 (LL) 10/18/2020   CL 102 10/18/2020   CO2 23 10/18/2020   GLUCOSE 137 (H) 10/18/2020   BUN 10 10/18/2020   CREATININE 1.29 (H) 10/18/2020   CALCIUM 8.0 (L) 10/18/2020   PROT 7.4 01/20/2018   ALBUMIN 4.1 01/20/2018   AST 37 01/20/2018   ALT 31 01/20/2018   ALKPHOS 71 01/20/2018   BILITOT 1.5 (H) 01/20/2018   GFRNONAA 56 (L) 10/18/2020   GFRAA >60 10/25/2019    Lab Results  Component Value Date   WBC 6.5 02/10/2022   NEUTROABS 3.6 02/10/2022   HGB 12.7 (L) 02/10/2022   HCT  37.1 (L) 02/10/2022   MCV 94.2 02/10/2022   PLT 262 02/10/2022   Lab Results  Component Value Date   IRON 88 01/28/2021   TIBC 409 01/28/2021   IRONPCTSAT 22 01/28/2021   Lab Results  Component Value Date   FERRITIN 30 02/10/2022   Lab Results  Component Value Date   TOTALPROTELP 7.0 10/18/2020   ALBUMINELP 4.2 10/18/2020   A1GS 0.2 10/18/2020   A2GS 0.9 10/18/2020   BETS 1.0 10/18/2020   GAMS 0.8 10/18/2020   MSPIKE 0.2 (  H) 10/18/2020   SPEI Comment 10/18/2020     STUDIES: No results found.  ASSESSMENT: Iron deficiency anemia, MGUS  PLAN:    1. Iron deficiency anemia: Improving counts which is reassuring. Patient's hemoglobin continues to be essentially within normal limits at 12.7 which is elevated from 12.4 at his last follow up.  He has a mildly decreased ferritin, but this is trended up since he initiated oral iron supplementation.  Patient previously had a reaction to Venofer so we have elected to continue monitoring especially since levels are increasing.  Continue yearly follow-up.    2. MGUS: Chronic  with acute elevation of light chains. M protein pending today.  BMP from 01/20/2022 shows a low calcium level (for which he is starting calcium supplementation) but otherwise does not show any concern for end organ damage. No new bony pains. If his M spike is stable we will watch him closely over the next 3 months. If significantly elevated I would recommend a bone survey and bone marrow biopsy. They are agreeable to our plan.  3.  Atypical CLL: Previously, patient was noted to have an abnormal flow cytometry with CD5+, CD23+, FMC7+, this cell population is classified as an atypical CLL phenotype.  FISH panel positive for trisomy 82 which is an intermediate prognosis for CLL.  No further intervention is needed.  Continue yearly follow-up as above.  Disposition: Protein electrophoresis today - if elevated above 0.3 I would recommend a bone marrow biopsy and bone scan given  significant elevation of flight chains.  RTC 3 months MD, labs   Patient expressed understanding and was in agreement with this plan. He also understands that He can call clinic at any time with any questions, concerns, or complaints.    Hughie Closs, PA-C   02/17/2022 3:34 PM

## 2022-02-19 LAB — PROTEIN ELECTROPHORESIS, SERUM
A/G Ratio: 1.4 (ref 0.7–1.7)
Albumin ELP: 3.9 g/dL (ref 2.9–4.4)
Alpha-1-Globulin: 0.1 g/dL (ref 0.0–0.4)
Alpha-2-Globulin: 0.9 g/dL (ref 0.4–1.0)
Beta Globulin: 0.9 g/dL (ref 0.7–1.3)
Gamma Globulin: 0.9 g/dL (ref 0.4–1.8)
Globulin, Total: 2.8 g/dL (ref 2.2–3.9)
M-Spike, %: 0.2 g/dL — ABNORMAL HIGH
Total Protein ELP: 6.7 g/dL (ref 6.0–8.5)

## 2022-02-25 ENCOUNTER — Telehealth: Payer: Self-pay

## 2022-02-25 NOTE — Telephone Encounter (Signed)
PC SW LVM for patient/spouse making of upcoming schedule change/cancellation with PC NP C. Gusler and to reschedule the visit from 11/16 to a later date.  Awaiting return call.

## 2022-02-27 ENCOUNTER — Other Ambulatory Visit: Payer: Medicare Other | Admitting: Nurse Practitioner

## 2022-03-17 ENCOUNTER — Telehealth: Payer: Self-pay

## 2022-03-17 NOTE — Telephone Encounter (Signed)
230 pm. Phone call made to wife Vaughan Basta to schedule a home visit with patient. No answer.  Message left requesting a call back.

## 2022-03-20 ENCOUNTER — Other Ambulatory Visit: Payer: Medicare Other

## 2022-03-28 ENCOUNTER — Other Ambulatory Visit: Payer: Medicare Other

## 2022-03-28 VITALS — BP 122/70 | HR 77 | Temp 97.5°F

## 2022-03-28 DIAGNOSIS — Z515 Encounter for palliative care: Secondary | ICD-10-CM

## 2022-03-28 NOTE — Progress Notes (Signed)
COMMUNITY PALLIATIVE CARE SW NOTE  PATIENT NAME: Kurt Miller DOB: Jul 09, 1940 MRN: [id number]  PRIMARY CARE PROVIDER: Baxter Hire, MD  RESPONSIBLE PARTY:  Acct ID - Guarantor Home Phone Work Phone Relationship Acct Type  [id number] CLAYBORN, [phone]  Self P/F     [address] [place], [place] [address]     PLAN OF CARE and INTERVENTIONS:             GOALS OF CARE/ ADVANCE CARE PLANNING:  Goals include to maximize quality of life and assist with pain management. Our advance care planning conversation included a discussion about:    The value and importance of advance care planning  Review and updating or creation of an advance directive document.                          Patient is currently a FULL CODE. MOST form reviewed with spouse and son and left in the home. Spouse has HCPOA with son as backup.  2.        SOCIAL/EMOTIONAL/SPIRITUAL ASSESSMENT/ INTERVENTIONS:         Palliative care encounter: SW and RN completed joint in home visit with patient and spouse. Patient son, [name], present as well.   Functional changes/updates: Patient with monoclonal gammopath cancer. Not receiving any form treatment at this time. Patient received sitting in living room. Patient share that he is doing well. Patient no longer attending [place] adult day program. Spouse share that she has seen some functional changes over the past 2 weeks to include cognitive decline. Patient is still ambulatory w/o AD.  Alzheimer's - patient may be experiencing disease progression.   Psychosocial assessment: completed.   In home support: patient resides in home with spouse. Spouse is main caregiver. Additional support discussed. Patient is [facility] connected. SW advised spouse to connect with [facility] SW to discuss aid & attendance program.   Transportation: no needs.  Food: no food insecurities witnessed.   Safety and long term planning: patient feels safe in his home and desires to remain in his  home with spouse. Life alert button discussed/   SW discussed goals, reviewed care plan, provided emotional support, used active and reflective listening in the form of reciprocity emotional response. Questions and concerns were addressed. The patient/family was encouraged to call with any additional questions and/or concerns. PC Provided general support and encouragement, no other unmet needs identified. Will continue to follow.   3.         PATIENT/CAREGIVER EDUCATION/ COPING:   Appearance: well groomed, appropriate given situation  Mental Status: Alert/oriented. With cognitive deficits Eye Contact: Good. Able to engage in proper eye contact  Thought Process: irrational  Thought Content: not assessed  Speech: normal  Mood: Normal and calm Affect: Congruent to endorsed mood, full ranging Insight: poor Judgement: poor  Interaction Style: Cooperative   Patient A&O, and able to engage in conversation this visit, and answer simple questions appropriately.    4.         PERSONAL EMERGENCY PLAN:  Patient/spouse will call 9-1-1 for emergencies.    5.         COMMUNITY RESOURCES COORDINATION/ HEALTH CARE NAVIGATION:  patients and children manages his care.    6.      FINANCIAL CONCERNS/NEEDS: Patient does not qualify for community Medicaid.  Primary Health Insurance:  HTA Medicare Secondary Health Insurance: none Prescription Coverage: Yes, no history of difficulty obtaining or affording prescriptions reported.     SOCIAL HX:  Social History   Tobacco Use   Smoking status: Former    Packs/day: 0.25    Years: 10.00    Total pack years: 2.50    Types: Cigarettes    Quit date: 05/12/1984    Years since quitting: 37.9   Smokeless tobacco: Never  Substance Use Topics   Alcohol use: No    CODE STATUS: Full code  ADVANCED DIRECTIVES: Y MOST FORM COMPLETE: TBD form left in the home HOSPICE EDUCATION PROVIDED: N  [id number] is MOD - A with ADL's.        Doreene Eland, [place]

## 2022-03-28 NOTE — Progress Notes (Signed)
PATIENT NAME: Kurt Miller DOB: 10-10-1940 MRN: [id number]  PRIMARY CARE PROVIDER: Baxter Hire, MD  RESPONSIBLE PARTY:  Acct ID - Guarantor Home Phone Work Phone Relationship Acct Type  [id number] [name], [name]  Self P/F     [address], [place], [place] [address]    ACP:  Currently a full code.  Discussed MOST form and blank form left in the home with Hard Choices booklet.  Family will review further.  Appetite:  Remains good.  No weight loss wife endorses weight has been stable.  Able to feed self no cues needed at this time.   Cognitive Impairment:   Increased confusion over the last 2 weeks.  [facility] checked [facility] yesterday and results are negative.  Patient is ambulating to the kitchen to use the bathroom.   Forgetting to change clothes at night.   Increase pacing in the home.  No wandering outside of the home reported.  Discussed safety measures with wife and son. Wife is now having to assist with dressing/undressing.  Patient resistant to bathing.  Wife voiced increased care needs for patient.  Discussed [facility] options and possible Aide and Attendance.  W[facility] will [facility] with [facility] for additional [facility].  No longer going to [facility].  Per wife patient did not enjoy going and was often refusing to go.  This stopped about 2 weeks ago.   Chronic Diarrhea:  Mostly resolved.  Likely medication related-mesalamine which has been stopped.   Follow up visit scheduled for January.   CODE STATUS: Full ADVANCED DIRECTIVES: Yes-wife and son (Rocky) hold HCPOA MOST FORM: No PPS: 40%   PHYSICAL EXAM:   VITALS: Today's Vitals   03/28/22 1112  BP: 122/70  Pulse: 77  Temp: (!) 97.5 F (36.4 C)  SpO2: 95%    LUNGS: clear to auscultation  CARDIAC: Cor RRR}  EXTREMITIES: - for edema SKIN: Skin color, texture, turgor normal. No rashes or lesions or normal  NEURO: positive for memory problems       Lorenza Burton, RN

## 2022-03-31 ENCOUNTER — Telehealth: Payer: Self-pay

## 2022-03-31 ENCOUNTER — Emergency Department: Payer: Medicare Other

## 2022-03-31 ENCOUNTER — Emergency Department
Admission: EM | Admit: 2022-03-31 | Discharge: 2022-03-31 | Discharge status: Against Medical Advice | Payer: Medicare Other | Attending: Family Medicine | Admitting: Family Medicine

## 2022-03-31 DIAGNOSIS — Z5321 Procedure and treatment not carried out due to patient leaving prior to being seen by health care provider: Secondary | ICD-10-CM | POA: Diagnosis not present

## 2022-03-31 DIAGNOSIS — R4182 Altered mental status, unspecified: Secondary | ICD-10-CM | POA: Insufficient documentation

## 2022-03-31 DIAGNOSIS — R42 Dizziness and giddiness: Secondary | ICD-10-CM | POA: Insufficient documentation

## 2022-03-31 DIAGNOSIS — I6612 Occlusion and stenosis of left anterior cerebral artery: Secondary | ICD-10-CM | POA: Diagnosis not present

## 2022-03-31 DIAGNOSIS — G309 Alzheimer's disease, unspecified: Secondary | ICD-10-CM | POA: Diagnosis not present

## 2022-03-31 DIAGNOSIS — I6622 Occlusion and stenosis of left posterior cerebral artery: Secondary | ICD-10-CM | POA: Insufficient documentation

## 2022-03-31 LAB — DIFFERENTIAL
Abs Immature Granulocytes: 0.03 10*3/uL (ref 0.00–0.07)
Basophils Absolute: 0 10*3/uL (ref 0.0–0.1)
Basophils Relative: 1 %
Eosinophils Absolute: 0 10*3/uL (ref 0.0–0.5)
Eosinophils Relative: 0 %
Immature Granulocytes: 1 %
Lymphocytes Relative: 40 %
Lymphs Abs: 2.3 10*3/uL (ref 0.7–4.0)
Monocytes Absolute: 0.7 10*3/uL (ref 0.1–1.0)
Monocytes Relative: 12 %
Neutro Abs: 2.8 10*3/uL (ref 1.7–7.7)
Neutrophils Relative %: 46 %

## 2022-03-31 LAB — COMPREHENSIVE METABOLIC PANEL
ALT: 25 U/L (ref 0–44)
AST: 28 U/L (ref 15–41)
Albumin: 4.1 g/dL (ref 3.5–5.0)
Alkaline Phosphatase: 93 U/L (ref 38–126)
Anion gap: 8 (ref 5–15)
BUN: 8 mg/dL (ref 8–23)
CO2: 23 mmol/L (ref 22–32)
Calcium: 9.2 mg/dL (ref 8.9–10.3)
Chloride: 108 mmol/L (ref 98–111)
Creatinine, Ser: 1.2 mg/dL (ref 0.61–1.24)
GFR, Estimated: >60 mL/min (ref >60)
Glucose, Bld: 92 mg/dL (ref 70–99)
Potassium: 3.6 mmol/L (ref 3.5–5.1)
Sodium: 139 mmol/L (ref 135–145)
Total Bilirubin: 0.8 mg/dL (ref 0.3–1.2)
Total Protein: 7.4 g/dL (ref 6.5–8.1)

## 2022-03-31 LAB — CBC
HCT: 40.2 % (ref 39.0–52.0)
Hemoglobin: 13.5 g/dL (ref 13.0–17.0)
MCH: 31.6 pg (ref 26.0–34.0)
MCHC: 33.6 g/dL (ref 30.0–36.0)
MCV: 94.1 fL (ref 80.0–100.0)
Platelets: 278 10*3/uL (ref 150–400)
RBC: 4.27 MIL/uL (ref 4.22–5.81)
RDW: 12.9 % (ref 11.5–15.5)
WBC: 5.9 10*3/uL (ref 4.0–10.5)
nRBC: 0 % (ref 0.0–0.2)

## 2022-03-31 LAB — TROPONIN I (HIGH SENSITIVITY): Troponin I (High Sensitivity): 8 ng/L (ref <18)

## 2022-03-31 LAB — PROTIME-INR
INR: 1.3 — ABNORMAL HIGH (ref 0.8–1.2)
Prothrombin Time: 15.8 seconds — ABNORMAL HIGH (ref 11.4–15.2)

## 2022-03-31 LAB — ETHANOL: Alcohol, Ethyl (B): <10 mg/dL (ref <10)

## 2022-03-31 LAB — APTT: aPTT: 33 seconds (ref 24–36)

## 2022-03-31 MED ORDER — SODIUM CHLORIDE 0.9% FLUSH: 3.0000 mL | Freq: Once | INTRAVENOUS | Status: DC

## 2022-03-31 NOTE — ED Provider Triage Note (Signed)
Emergency Medicine Provider Triage Evaluation Note  Kurt Miller , a 81 y.o. male  was evaluated in triage.  Per wife, patient has Alzheimer's. For the past 2 weeks, he has had worsening confusion. Today, he has complained of seeing flashing lights. Patient denies headache.   Physical Exam  BP (!) 140/99 (BP Location: Right Arm)   Pulse 97   Temp 98.1 F (36.7 C) (Oral)   Resp 18   Wt 99.8 kg   SpO2 98%   BMI 32.49 kg/m  Gen:   Awake, no distress   Resp:  Normal effort  MSK:   Moves extremities without difficulty  Other:    Medical Decision Making  Medically screening exam initiated at 1:37 PM.  Appropriate orders placed.  Kurt Miller was informed that the remainder of the evaluation will be completed by another provider, this initial triage assessment does not replace that evaluation, and the importance of remaining in the ED until their evaluation is complete.  CVA workup started. Outside of code stroke window.   Kurt Dike, FNP 03/31/22 1341

## 2022-03-31 NOTE — Progress Notes (Signed)
[facility]) Hospital Liaison note:  This patient is currently enrolled in [facility] outpatient-based Palliative Care. Will continue to follow for disposition.  Please call with any outpatient palliative questions or concerns.  Thank you, Lorelee Market, LPN [facility] Liaison [phone]

## 2022-03-31 NOTE — ED Triage Notes (Signed)
Per pt wife, pt has been having dizziness and more confusion with seeing flashing lights. Per wife pt has dementia but has been getting worse with confusion and unable to have a steady gate. Wife sts that pt has been having this since Saturday.

## 2022-03-31 NOTE — Telephone Encounter (Signed)
145 pm.  Message received from wife Vaughan Basta requesting a callback.  Call back made and patient is currently in the [facility] ED for evaluation of CVA.  Wife advised they are waiting for the CT scan to be completed.  Notified Palliative Care hospital liaison team of patient's location.

## 2022-04-15 ENCOUNTER — Telehealth: Payer: Self-pay

## 2022-04-15 NOTE — Telephone Encounter (Signed)
TC from patient spouse, Lyman Bishop hared that patient has been IP at [facility] since 04/03/22 and is recovering from [place]. Hospital team is recommending STR for patient. Spouse inquired about rehab facilities in the area and Medicare coverage. Family to advise DC case worker of their preference facilities being [place], [place], [facility] and [facility]. SW advised spouse of continued PC coverage while in STR - patient can be followed by [facility] NP and post Dc from facility [facility] RN/SW team will begin  following again.  Spouse appreciative of feedback and had no further questions at this time.

## 2022-04-24 ENCOUNTER — Other Ambulatory Visit: Payer: Medicare Other

## 2022-05-14 ENCOUNTER — Telehealth: Payer: Self-pay | Admitting: Oncology

## 2022-05-14 NOTE — Telephone Encounter (Signed)
Just was discharged from hospital and is bedridden. Will call back if and when he can reschedule Per wife.

## 2022-05-22 ENCOUNTER — Other Ambulatory Visit: Payer: Commercial Managed Care - PPO

## 2022-05-22 ENCOUNTER — Ambulatory Visit: Payer: Commercial Managed Care - PPO | Admitting: Oncology

## 2022-07-16 IMAGING — MR MR HEAD W/O CM
15 of 23 series · 23 of 48 positions shown · non-contrast
Comparison: Head MRI 08/13/2012

CLINICAL DATA: Memory loss.

EXAM:
MRI HEAD WITHOUT CONTRAST
TECHNIQUE: Multiplanar, multiecho pulse sequences of the brain and surrounding
structures were obtained without intravenous contrast.
Additionally, using NeuroQuant software a 3D volumetric analysis of
the brain was performed and is compared to a normative database
adjusted for age, gender and intracranial volume.

[Series 2: T1 · sagittal · 1.2mm · 0.94mm/px · 1 of 160 slices shown]
[im 1/160]
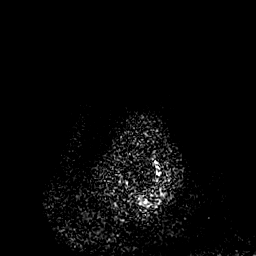

[Series 3: DWI · axial · 3.0mm · 0.94mm/px · 1 of 106 slices shown (1 of 2)]
[im 1/106]
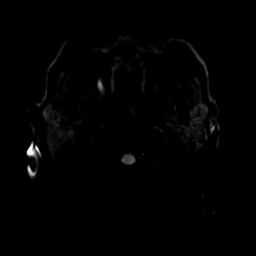

[Series 4: DWI · coronal · 4.0mm · 0.94mm/px · 1 of 74 slices shown (2 of 2)]
[im 1/74]
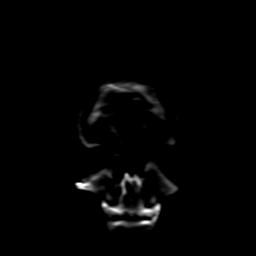

[Series 5: FLAIR · sagittal · 5.0mm · 0.23mm/px · 1 of 27 slices shown (1 of 2)]
[im 1/27]
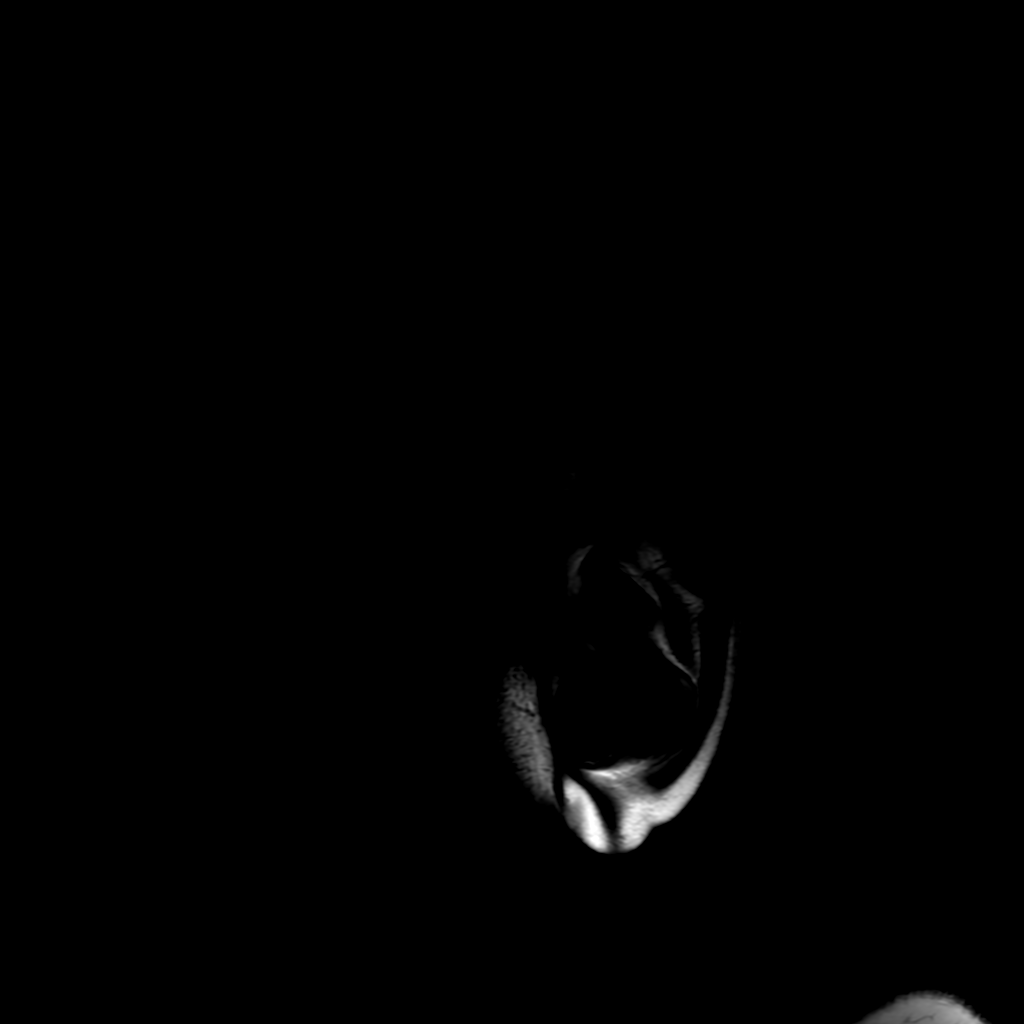

[Series 6: T2 · axial · 5.0mm · 0.23mm/px · 1 of 28 slices shown (1 of 2)]
[im 1/28]
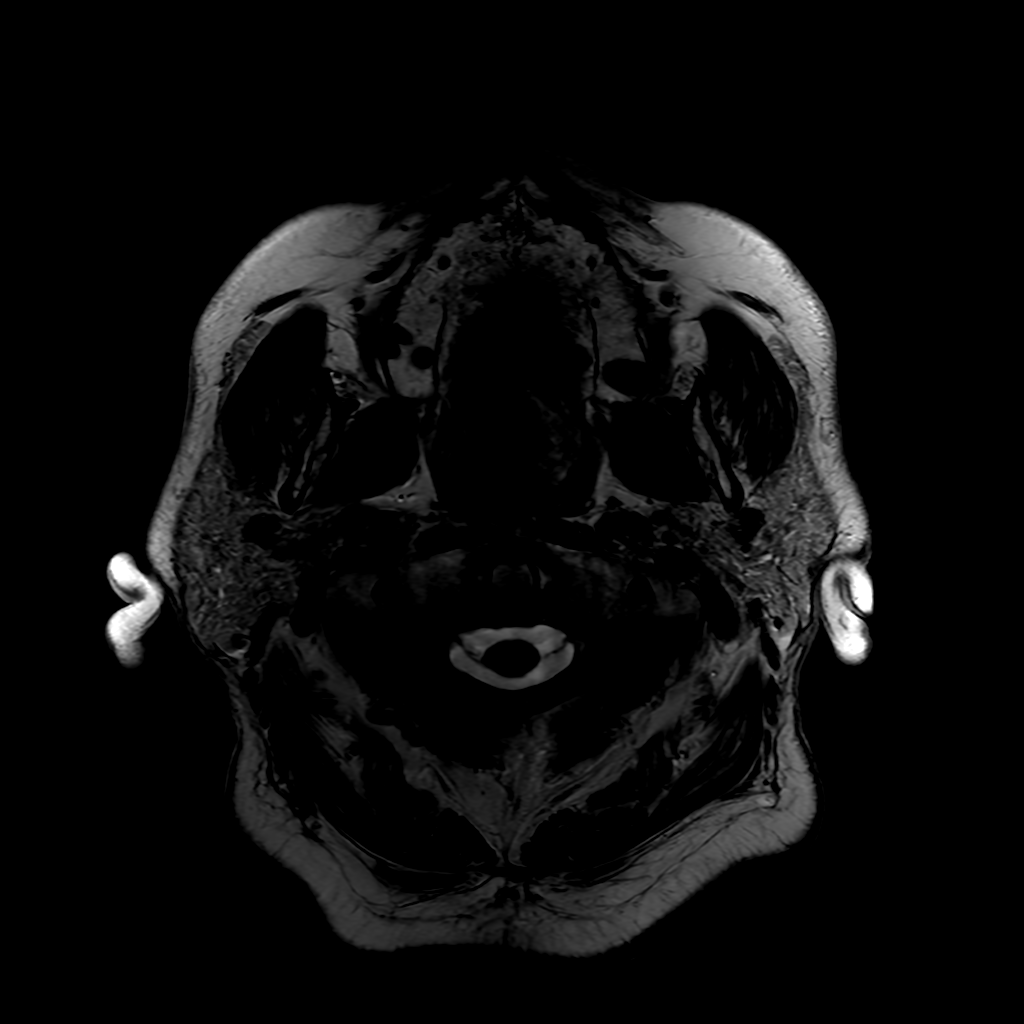

[Series 7: FLAIR · axial · 4.0mm · 0.45mm/px · 1 of 38 slices shown (2 of 2)]
[im 1/38]
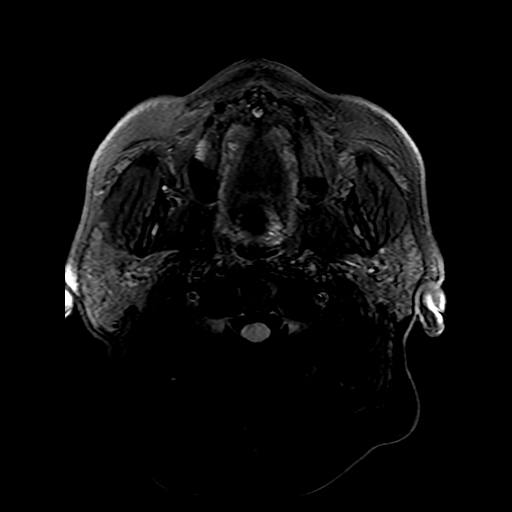

[Series 8: (person_name) · axial · 3.0mm · 0.47mm/px · z∈[-111,+55]mm · 2 of 112 slices shown]
[im 1/112]
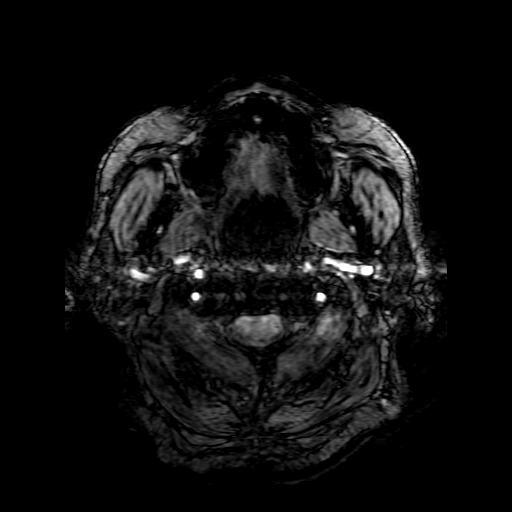
[im 112/112]
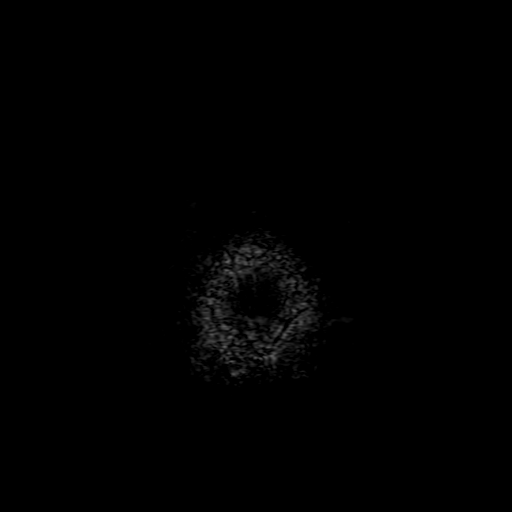

[Series 9: ax 3(person_name) pre · axial · non-contrast · 3.0mm · 0.94mm/px · 1 of 60 slices shown]
[im 1/60]
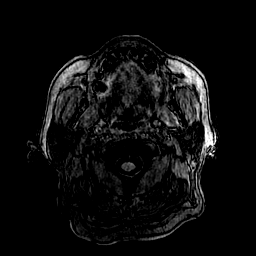

[Series 10: T2 · coronal · 5.0mm · 0.20mm/px · 1 of 32 slices shown (2 of 2)]
[im 1/32]
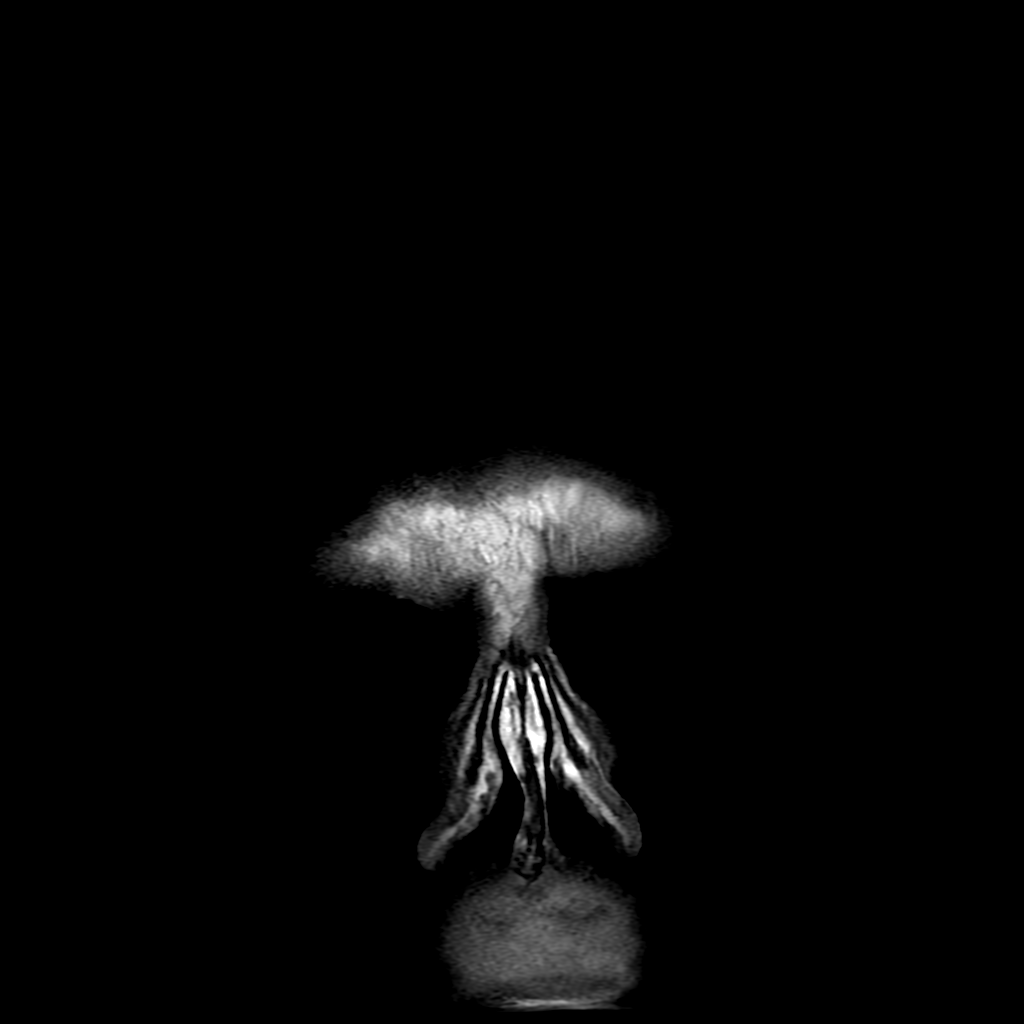

[Series 52: nqsegcorsc · 1.00mm/px · 3 of 233 slices shown (1 of 4)]
[im 1/233]
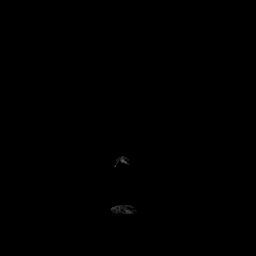
[im 117/233]
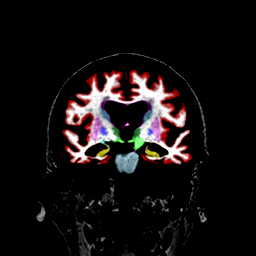
[im 233/233]
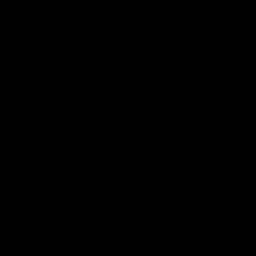

[Series 52: nqsegcorsc · 1.00mm/px · 3 of 233 slices shown (2 of 4)]
[im 1/233]
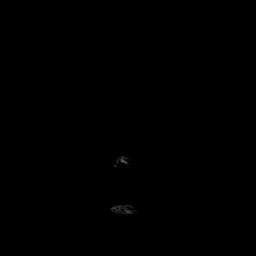
[im 117/233]
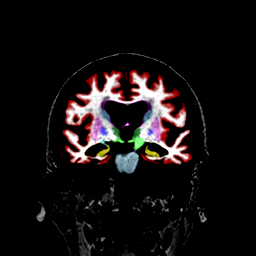
[im 233/233]
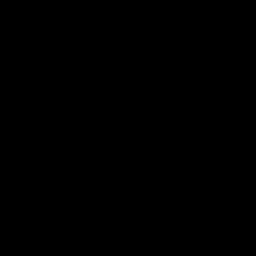

[Series 52: nqsegcorsc · 1.00mm/px · 3 of 233 slices shown (3 of 4)]
[im 1/233]
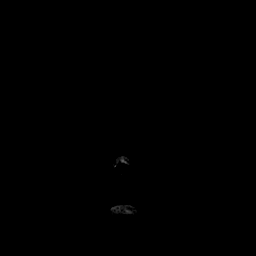
[im 117/233]
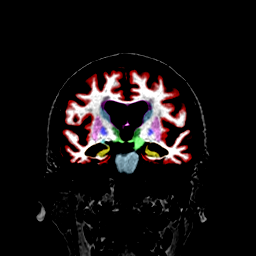
[im 233/233]
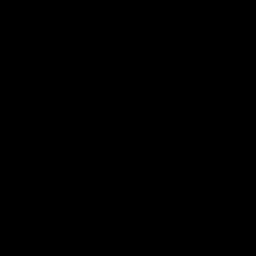

[Series 52: nqsegcorsc · 1.00mm/px · 2 of 233 slices shown (4 of 4)]
[im 1/233]
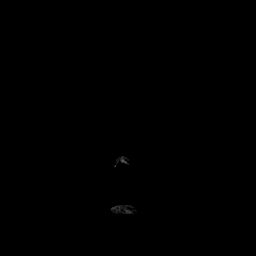
[im 117/233]
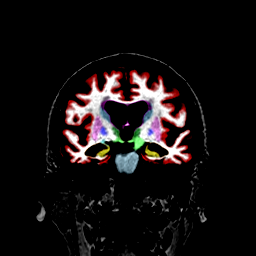

[Series 350: ADC · axial · 3.0mm · 0.94mm/px · 1 of 54 slices shown (1 of 2)]
[im 1/54]
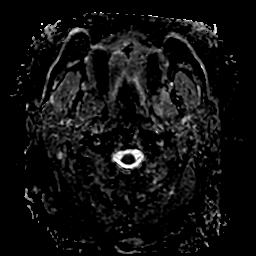

[Series 450: ADC · coronal · 4.0mm · 0.94mm/px · 1 of 36 slices shown (2 of 2)]
[im 1/36]
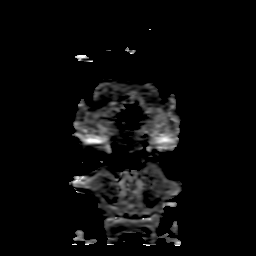

[23 of 48 positions shown; findings below may reference images not displayed]

FINDINGS: Brain: There is no evidence of an acute infarct, intracranial
hemorrhage, mass, midline shift, or extra-axial fluid collection.
Patchy T2 hyperintensities in the cerebral white matter bilaterally
have progressed from the prior MRI and are nonspecific but
compatible with moderate chronic small vessel ischemic disease.
Cerebral atrophy has progressed, and there is prominent bilateral
temporal lobe volume loss.

Vascular: Major intracranial vascular flow voids are preserved.

Skull and upper cervical spine: Unremarkable bone marrow signal.

Sinuses/Orbits: Unremarkable orbits. Minimal mucosal thickening in
the maxillary sinuses. Clear mastoid air cells.

Other: None.

NeuroQuant Findings:

Volumetric analysis of the brain was performed, with a fully
detailed report in [HOSPITAL] PACS. Briefly, the comparison with age and
gender matched reference reveals whole brain and hippocampal volume
to be at the 1st percentile.
IMPRESSION: 1. No acute intracranial abnormality.
2. Progressive chronic small vessel ischemic disease and cerebral
atrophy.
3. NeuroQuant volumetric analysis of the brain, see details on
[HOSPITAL] PACS.

## 2023-04-15 DEATH — deceased
